# Patient Record
Sex: Female | Born: 1990 | Race: Black or African American | Hispanic: No | Marital: Single | Smoking: Never smoker
Health system: Southern US, Community
[De-identification: ages and names within clinical notes are randomized; demographics above are authoritative.]

## PROBLEM LIST (undated history)

## (undated) ENCOUNTER — Inpatient Hospital Stay (HOSPITAL_COMMUNITY): Payer: Self-pay

## (undated) DIAGNOSIS — F419 Anxiety disorder, unspecified: Secondary | ICD-10-CM

## (undated) DIAGNOSIS — A64 Unspecified sexually transmitted disease: Secondary | ICD-10-CM

## (undated) DIAGNOSIS — E669 Obesity, unspecified: Secondary | ICD-10-CM

## (undated) DIAGNOSIS — F32A Depression, unspecified: Secondary | ICD-10-CM

## (undated) DIAGNOSIS — F329 Major depressive disorder, single episode, unspecified: Secondary | ICD-10-CM

## (undated) DIAGNOSIS — R51 Headache: Secondary | ICD-10-CM

## (undated) DIAGNOSIS — N8003 Adenomyosis of the uterus: Secondary | ICD-10-CM

## (undated) HISTORY — DX: Unspecified sexually transmitted disease: A64

## (undated) HISTORY — DX: Adenomyosis of the uterus: N80.03

## (undated) HISTORY — PX: SLEEVE GASTROPLASTY: SHX1101

## (undated) HISTORY — PX: WISDOM TOOTH EXTRACTION: SHX21

---

## 2009-05-03 ENCOUNTER — Inpatient Hospital Stay (HOSPITAL_COMMUNITY): Admission: AD | Admit: 2009-05-03 | Discharge: 2009-05-03 | Payer: Self-pay | Admitting: Obstetrics and Gynecology

## 2009-08-02 ENCOUNTER — Inpatient Hospital Stay (HOSPITAL_COMMUNITY): Admission: AD | Admit: 2009-08-02 | Discharge: 2009-08-06 | Payer: Self-pay | Admitting: Obstetrics and Gynecology

## 2009-08-03 ENCOUNTER — Encounter (INDEPENDENT_AMBULATORY_CARE_PROVIDER_SITE_OTHER): Payer: Self-pay | Admitting: Obstetrics and Gynecology

## 2010-01-10 ENCOUNTER — Emergency Department (HOSPITAL_COMMUNITY): Admission: EM | Admit: 2010-01-10 | Discharge: 2010-01-10 | Payer: Self-pay | Admitting: Emergency Medicine

## 2010-02-14 ENCOUNTER — Emergency Department (HOSPITAL_COMMUNITY)
Admission: EM | Admit: 2010-02-14 | Discharge: 2010-02-14 | Payer: Self-pay | Source: Home / Self Care | Admitting: Emergency Medicine

## 2010-02-15 ENCOUNTER — Emergency Department (HOSPITAL_COMMUNITY)
Admission: EM | Admit: 2010-02-15 | Discharge: 2010-02-15 | Disposition: A | Discharge status: Other Acute Inpatient Hospital | Payer: Self-pay | Source: Home / Self Care | Admitting: Family Medicine

## 2010-02-15 ENCOUNTER — Emergency Department (HOSPITAL_COMMUNITY)
Admission: EM | Admit: 2010-02-15 | Discharge: 2010-02-15 | Payer: Self-pay | Source: Home / Self Care | Admitting: Emergency Medicine

## 2010-05-09 LAB — BASIC METABOLIC PANEL
BUN: 5 mg/dL — ABNORMAL LOW (ref 6–23)
Calcium: 9.4 mg/dL (ref 8.4–10.5)
Creatinine, Ser: 0.61 mg/dL (ref 0.4–1.2)
GFR calc non Af Amer: >60 mL/min (ref >60)
Glucose, Bld: 110 mg/dL — ABNORMAL HIGH (ref 70–99)
Potassium: 3.5 mEq/L (ref 3.5–5.1)
Sodium: 138 mEq/L (ref 135–145)

## 2010-05-09 LAB — DIFFERENTIAL
Eosinophils Absolute: 0.4 10*3/uL (ref 0.0–0.7)
Neutro Abs: 7.5 10*3/uL (ref 1.7–7.7)
Neutrophils Relative %: 65 % (ref 43–77)

## 2010-05-09 LAB — CBC
HCT: 37.6 % (ref 36.0–46.0)
MCH: 28.5 pg (ref 26.0–34.0)
MCV: 83 fL (ref 78.0–100.0)
Platelets: 258 10*3/uL (ref 150–400)
RDW: 13.3 % (ref 11.5–15.5)

## 2010-05-16 LAB — CBC
HCT: 25.3 % — ABNORMAL LOW (ref 36.0–46.0)
Hemoglobin: 12.2 g/dL (ref 12.0–15.0)
Hemoglobin: 8.9 g/dL — ABNORMAL LOW (ref 12.0–15.0)
MCV: 90 fL (ref 78.0–100.0)
MCV: 91.3 fL (ref 78.0–100.0)
Platelets: 226 10*3/uL (ref 150–400)
RBC: 3.98 MIL/uL (ref 3.87–5.11)
RDW: 14.8 % (ref 11.5–15.5)
WBC: 17.4 10*3/uL — ABNORMAL HIGH (ref 4.0–10.5)

## 2010-05-16 LAB — RPR: RPR Ser Ql: NONREACTIVE

## 2010-05-23 LAB — URINALYSIS, ROUTINE W REFLEX MICROSCOPIC
Bilirubin Urine: NEGATIVE
Glucose, UA: NEGATIVE mg/dL
Hgb urine dipstick: NEGATIVE
Protein, ur: NEGATIVE mg/dL
Specific Gravity, Urine: 1.02 (ref 1.005–1.030)

## 2010-05-23 LAB — WET PREP, GENITAL
Clue Cells Wet Prep HPF POC: NONE SEEN
Trich, Wet Prep: NONE SEEN
Yeast Wet Prep HPF POC: NONE SEEN

## 2010-05-23 LAB — URINE CULTURE: Colony Count: NO GROWTH

## 2010-05-23 LAB — URINE MICROSCOPIC-ADD ON

## 2011-08-08 ENCOUNTER — Other Ambulatory Visit: Payer: Self-pay | Admitting: Obstetrics & Gynecology

## 2011-08-08 ENCOUNTER — Emergency Department (HOSPITAL_COMMUNITY): Payer: Medicaid Other

## 2011-08-08 ENCOUNTER — Encounter (HOSPITAL_COMMUNITY): Payer: Self-pay | Admitting: *Deleted

## 2011-08-08 ENCOUNTER — Emergency Department (HOSPITAL_COMMUNITY)
Admission: EM | Admit: 2011-08-08 | Discharge: 2011-08-08 | Disposition: A | Discharge status: Home / Self Care | Payer: Medicaid Other | Attending: Emergency Medicine | Admitting: Emergency Medicine

## 2011-08-08 DIAGNOSIS — O200 Threatened abortion: Secondary | ICD-10-CM

## 2011-08-08 DIAGNOSIS — O039 Complete or unspecified spontaneous abortion without complication: Secondary | ICD-10-CM | POA: Insufficient documentation

## 2011-08-08 LAB — POCT I-STAT, CHEM 8
BUN: 10 mg/dL (ref 6–23)
Chloride: 108 mEq/L (ref 96–112)
Creatinine, Ser: 0.6 mg/dL (ref 0.50–1.10)
Glucose, Bld: 142 mg/dL — ABNORMAL HIGH (ref 70–99)
Hemoglobin: 9.9 g/dL — ABNORMAL LOW (ref 12.0–15.0)
Potassium: 4.3 mEq/L (ref 3.5–5.1)
Sodium: 140 mEq/L (ref 135–145)
TCO2: 19 mmol/L (ref 0–100)

## 2011-08-08 LAB — TYPE AND SCREEN
ABO/RH(D): O POS
Antibody Screen: NEGATIVE

## 2011-08-08 LAB — CBC
MCH: 27.8 pg (ref 26.0–34.0)
RBC: 3.45 MIL/uL — ABNORMAL LOW (ref 3.87–5.11)
WBC: 13.2 10*3/uL — ABNORMAL HIGH (ref 4.0–10.5)

## 2011-08-08 LAB — GC/CHLAMYDIA PROBE AMP, GENITAL: GC Probe Amp, Genital: NEGATIVE

## 2011-08-08 MED ORDER — MORPHINE SULFATE 4 MG/ML IJ SOLN
4.0000 mg | Freq: Once | INTRAMUSCULAR | Status: AC
  Administered 2011-08-08: 4 mg via INTRAVENOUS
  Filled 2011-08-08: qty 1

## 2011-08-08 MED ORDER — IBUPROFEN 600 MG PO TABS: 600.0000 mg | ORAL_TABLET | Freq: Four times a day (QID) | ORAL | Status: AC | PRN

## 2011-08-08 MED ORDER — SODIUM CHLORIDE 0.9 % IV BOLUS (SEPSIS)
1000.0000 mL | Freq: Once | INTRAVENOUS | Status: AC
  Administered 2011-08-08: 1000 mL via INTRAVENOUS

## 2011-08-08 NOTE — ED Provider Notes (Signed)
History     CSN: [id number]  Arrival date & time 08/08/11  7829   First MD Initiated Contact with Patient 08/08/11 [phone]      Chief Complaint  Patient presents with  . Vaginal Bleeding    started 2200     HPI Pt thinks she could be having a miscarriage.  Her last normal menstrual period was in April.  Pt had been on depo and her last shot was in January.  She had a positive pregnancy test in April but then another one following that was negative.  She started having heavy vaginal bleeding last night.  She has had  cramping abdominal pain as well.  She has passed clots.  The symptoms are not any better.  They are severe. The pain feels like labor contractions. g1p1  History reviewed. No pertinent past medical history.  Past Surgical History  Procedure Date  . Cesarean section     No family history on file.  History  Substance Use Topics  . Smoking status: Never Smoker   . Smokeless tobacco: Not on file  . Alcohol Use: Yes     occ    OB History    Grav Para Term Preterm Abortions TAB SAB Ect Mult Living                  Review of Systems  Genitourinary: Positive for pelvic pain.  Neurological: Positive for light-headedness. Negative for syncope.  All other systems reviewed and are negative.    Allergies  Review of patient's allergies indicates no known allergies.  Home Medications  No current outpatient prescriptions on file.  BP 96/54  Pulse 76  Temp(Src) 98.6 F (37 C) (Oral)  Resp 16  SpO2 100% Repeat bp 111/62 at bedside Physical Exam  Nursing note and vitals reviewed. Constitutional: She appears well-developed and well-nourished. No distress.  HENT:  Head: Normocephalic and atraumatic.  Right Ear: External ear normal.  Left Ear: External ear normal.  Eyes: Conjunctivae are normal. Right eye exhibits no discharge. Left eye exhibits no discharge. No scleral icterus.  Neck: Neck supple. No tracheal deviation present.  Cardiovascular: Normal rate,  regular rhythm and intact distal pulses.   Pulmonary/Chest: Effort normal and breath sounds normal. No stridor. No respiratory distress. She has no wheezes. She has no rales.  Abdominal: Soft. Bowel sounds are normal. She exhibits no distension. There is tenderness in the suprapubic area. There is guarding. There is no rebound.  Musculoskeletal: She exhibits no edema and no tenderness.  Neurological: She is alert. She has normal strength. No sensory deficit. Cranial nerve deficit:  no gross defecits noted. She exhibits normal muscle tone. She displays no seizure activity. Coordination normal.  Skin: Skin is warm and dry. No rash noted.  Psychiatric: She has a normal mood and affect.    ED Course  Procedures (including critical care time)  Labs Reviewed  CBC - Abnormal; Notable for the following:    WBC 13.2 (*)    RBC 3.45 (*)    Hemoglobin 9.6 (*)    HCT 28.2 (*)    All other components within normal limits  PREGNANCY, URINE - Abnormal; Notable for the following:    Preg Test, Ur POSITIVE (*)    All other components within normal limits  HCG, SERUM, QUALITATIVE - Abnormal; Notable for the following:    Preg, Serum POSITIVE (*)    All other components within normal limits  POCT I-STAT, CHEM 8 - Abnormal; Notable for the  following:    Glucose, Bld 142 (*)    Hemoglobin 9.9 (*)    HCT 29.0 (*)    All other components within normal limits  HCG, QUANTITATIVE, PREGNANCY - Abnormal; Notable for the following:    hCG, Beta Chain, Quant, S 4789 (*)    All other components within normal limits  TYPE AND SCREEN  ABO/RH  GC/CHLAMYDIA PROBE AMP, GENITAL   US Ob Comp Less 14 Wks  08/08/2011  *RADIOLOGY REPORT*  Clinical Data: Vaginal bleeding.  Pelvic pain.  7-week-6-day gestational age by LMP.  OBSTETRIC <14 WK [place] AND TRANSVAGINAL OB US  Technique:  Both transabdominal and transvaginal ultrasound examinations were performed for complete evaluation of the gestation as well as the maternal  uterus, adnexal regions, and pelvic cul-de-sac.  Transvaginal technique was performed to assess early pregnancy.  Comparison:  None.  Findings:  There is no evidence of an intrauterine gestational sac. The endometrium appears thickened and there is a focal hypoechoic area seen in the posterior portion of the endometrial cavity suspicious for blood clot.  No definite fibroids identified.  Both ovaries are normal in appearance.  No adnexal mass or free fluid identified.  IMPRESSION: No IUP or adnexal mass identified.  Probable blood clot seen in endometrial cavity.  Differential diagnosis includes recent spontaneous abortion, IUP too early to visualize, and occult ectopic pregnancy.  Recommend close follow-up of quantitative B-HCG levels, and followup US as clinically warranted.  Original Report Authenticated By: Danae Orleans, M.D.   US Ob Transvaginal  08/08/2011  *RADIOLOGY REPORT*  Clinical Data: Vaginal bleeding.  Pelvic pain.  7-week-6-day gestational age by LMP.  OBSTETRIC <14 WK [place] AND TRANSVAGINAL OB US  Technique:  Both transabdominal and transvaginal ultrasound examinations were performed for complete evaluation of the gestation as well as the maternal uterus, adnexal regions, and pelvic cul-de-sac.  Transvaginal technique was performed to assess early pregnancy.  Comparison:  None.  Findings:  There is no evidence of an intrauterine gestational sac. The endometrium appears thickened and there is a focal hypoechoic area seen in the posterior portion of the endometrial cavity suspicious for blood clot.  No definite fibroids identified.  Both ovaries are normal in appearance.  No adnexal mass or free fluid identified.  IMPRESSION: No IUP or adnexal mass identified.  Probable blood clot seen in endometrial cavity.  Differential diagnosis includes recent spontaneous abortion, IUP too early to visualize, and occult ectopic pregnancy.  Recommend close follow-up of quantitative B-HCG levels, and followup US as  clinically warranted.  Original Report Authenticated By: Danae Orleans, M.D.     No diagnosis found.    MDM  10:43 AM Reviewed findings with patient.  She is feeling better.  Bleeding has decreased.  DDX still includes occult ectopic and spontaneous AB, less likely early IUP  With the amount of bleeding and pain.  Discussed with Dr Hyacinth Meeker, on call for GYN.  Will review the [place].  Most likely consistent miscarriage.     D/w Dr Hyacinth Meeker.  Will follow up on Thursday for repeat quant.  Close precautions discussed with patient.         Celene Kras, MD 08/08/11 [phone]

## 2011-08-08 NOTE — ED Notes (Signed)
Pt is on depo irreg periods.  Started having vaginal bleeding that is bright red with chucks,  Pt states soaking sanitary napkins.  Pt states that it feels like she is in labor, pt had a positive pregnancy test in April and took another and it was negative.  Pt hypotensive in triage.  bp 82/60.  Laid down and bp 98/70.  Lower abdominal pain

## 2011-08-08 NOTE — Discharge Instructions (Signed)
Miscarriage (Spontaneous Miscarriage)  A miscarriage is when you lose your baby before the twentieth week of pregnancy. Miscarriages happen in 15-20% of pregnancies. Most miscarriages happen in the first 13 weeks of the pregnancy. In medical terms, this is called a spontaneous miscarriage or early pregnancy loss. No further treatment is needed when the miscarriage is complete and all products of conception have been passed out of the body. You can begin trying for another pregnancy as soon as your caregiver says it is okay.  CAUSES    Most causes are not known.   Genetic problems like abnormal, not enough or too many chromosomes.   Infection of the cervix or uterus.   An abnormal shaped uterus, fibroid tumors or congenital abnormalities.   Hormone problems.   Medical problems.   Incompetent cervix, the tissue in the cervix is not strong enough to hold the pregnancy.   Smoking, too much alcohol use and illegal drugs.   Trauma.  SYMPTOMS    Bleeding or spotting from the vagina.   Cramping of the lower abdomen.   Passing of fluid from the vagina with or without cramps or pain.   Passing fetal tissue.  TREATMENT    Sometimes no further treatment is necessary if you pass all the tissue in the uterus.   If partial parts of the fetus or placenta remain in the body (incomplete miscarriage), tissue left behind may become infected. Usually a D and C (Dilatation and Curettage) suction or scrapping of the uterus is necessary to remove the remaining tissue in uterus. The procedure is only done when your caregiver knows that there is no chance for the pregnancy to continue. This is determined by a physical exam, a negative pregnancy test, blood tests and perhaps an ultrasound revealing a dead fetus or no fetus developing because a problem occurred at conception (when the sperm and egg unite).   Medications may be necessary, antibiotics if there is an infection or medications to contract the uterus if there is a  lot of bleeding.   If you have Rh negative blood and your partner is Rh positive, you will need a Rho-gam shot (an immune globulin vaccine). This will protect your baby from having Rh blood problems in future pregnancies.  HOME CARE INSTRUCTIONS    Your caregiver may order bed rest (up to the bathroom only). He or she may allow you to continue light activity. You may need to make arrangements for the care of children and for any other responsibilities.   Keep track of the number of pads you use each day and how soaked (saturated) they are. Record this information.   Do not use tampons. Do not douche or have sexual intercourse until approved by your caregiver.   Only take over-the-counter or prescription medicines for pain, discomfort or fever as directed by your caregiver.   Do not take aspirin because it can cause bleeding.   It is very important to keep all follow-up appointments for re-evaluations and continuing management.   Tell your caregiver if you are experiencing domestic violence.   Women who have an Rh negative blood type (i.e., A, B, AB, or O negative) need to receive a drug called Rh(D) immune globulin (RhoGam). This medicine helps protect future fetuses against problems that can occur if an Rh negative mother is carrying a baby who is Rh positive.   If you and/or your partner are having problems with guilt or grieving, talk to your caregiver or seek counseling to help   you cope with the pregnancy loss. Allow enough time to grieve before trying to get pregnant again.  SEEK IMMEDIATE MEDICAL CARE IF:    You have severe cramps or pain in your stomach, back, or belly (abdomen).   You have a fever.   You pass large clots or tissue. Save any tissue for your caregiver to inspect.   Your bleeding increases.   You become light-headed, weak or have fainting episodes.   You develop chills.  Document Released: 08/09/2000 Document Revised: 02/02/2011 Document Reviewed: 09/16/2007  [employer] Patient  Information 2012 [employer]

## 2011-08-08 NOTE — ED Notes (Signed)
Ambulated patient without any problems. Patient stated she felt ok walking.

## 2011-08-16 ENCOUNTER — Encounter: Payer: Self-pay | Admitting: Obstetrics & Gynecology

## 2011-08-16 ENCOUNTER — Telehealth: Payer: Self-pay | Admitting: Obstetrics & Gynecology

## 2011-08-16 NOTE — Telephone Encounter (Signed)
Patient did not follow up as directed after ER visit.  She was to be seen for follow-up HCG at [facility].  Have attempted phone calls to all numbers provided.  No answers and no voice mails are set up.  A letter will be sent from my office.

## 2011-10-29 ENCOUNTER — Encounter (HOSPITAL_COMMUNITY): Payer: Self-pay

## 2011-10-29 ENCOUNTER — Emergency Department (HOSPITAL_COMMUNITY)
Admission: EM | Admit: 2011-10-29 | Discharge: 2011-10-29 | Disposition: A | Discharge status: Home / Self Care | Payer: Medicaid Other | Source: Home / Self Care | Attending: Family Medicine | Admitting: Family Medicine

## 2011-10-29 DIAGNOSIS — H00019 Hordeolum externum unspecified eye, unspecified eyelid: Secondary | ICD-10-CM

## 2011-10-29 DIAGNOSIS — H00011 Hordeolum externum right upper eyelid: Secondary | ICD-10-CM

## 2011-10-29 MED ORDER — CEPHALEXIN 500 MG PO CAPS: 500.0000 mg | ORAL_CAPSULE | Freq: Three times a day (TID) | ORAL | Status: AC

## 2011-10-29 MED ORDER — ERYTHROMYCIN 5 MG/GM OP OINT: TOPICAL_OINTMENT | OPHTHALMIC | Status: AC

## 2011-10-29 NOTE — ED Provider Notes (Signed)
History     CSN: [id number]  Arrival date & time 10/29/11  1121   First MD Initiated Contact with Patient 10/29/11 1124      No chief complaint on file.   (Consider location/radiation/quality/duration/timing/severity/associated sxs/prior treatment) Patient is a 21 y.o. female presenting with eye problem. The history is provided by the patient.  Eye Problem  This is a new problem. The current episode started more than 2 days ago. The problem has been gradually worsening (h/o twice a yr conjuctivitis. sx similar.). There is pain in the right eye. The patient is experiencing no pain. There is no history of trauma to the eye. There is no known exposure to pink eye. She does not wear contacts. Pertinent negatives include no discharge, no photophobia and no eye redness.    No past medical history on file.  Past Surgical History  Procedure Date  . Cesarean section     No family history on file.  History  Substance Use Topics  . Smoking status: Never Smoker   . Smokeless tobacco: Not on file  . Alcohol Use: Yes     occ    OB History    Grav Para Term Preterm Abortions TAB SAB Ect Mult Living                  Review of Systems  Constitutional: Negative.   HENT: Negative.   Eyes: Positive for pain. Negative for photophobia, discharge, redness and visual disturbance.    Allergies  Review of patient's allergies indicates no known allergies.  Home Medications   Current Outpatient Rx  Name Route Sig Dispense Refill  . CEPHALEXIN 500 MG PO CAPS Oral Take 1 capsule (500 mg total) by mouth 3 (three) times daily. Take all of medicine and drink lots of fluids 21 capsule 0  . ERYTHROMYCIN 5 MG/GM OP OINT  Apply qid to eye after warm cloth soak 3.5 g 0    BP 115/72  Pulse 73  Temp 98.8 F (37.1 C) (Oral)  Resp 18  SpO2 99%  Physical Exam  Nursing note and vitals reviewed. Constitutional: She appears well-developed and well-nourished.  HENT:  Right Ear: External ear  normal.  Left Ear: External ear normal.  Mouth/Throat: Oropharynx is clear and moist.  Eyes: Conjunctivae and EOM are normal. Pupils are equal, round, and reactive to light. No foreign bodies found. Right eye exhibits hordeolum. Right eye exhibits no chemosis, no discharge and no exudate. Left eye exhibits no hordeolum.    ED Course  Procedures (including critical care time)  Labs Reviewed - No data to display No results found.   1. Hordeolum externum of right upper eyelid       MDM          Linna Hoff, MD 10/29/11 1253

## 2011-10-29 NOTE — ED Notes (Signed)
Pt has swollen rt eye since Friday.

## 2011-10-29 NOTE — ED Notes (Signed)
Pt has swelling and pain of rt upper lid since Friday.

## 2012-06-25 ENCOUNTER — Emergency Department (HOSPITAL_COMMUNITY)
Admission: EM | Admit: 2012-06-25 | Discharge: 2012-06-25 | Disposition: A | Discharge status: Home / Self Care | Payer: Medicaid Other | Source: Home / Self Care | Attending: Family Medicine | Admitting: Family Medicine

## 2012-06-25 ENCOUNTER — Encounter (HOSPITAL_COMMUNITY): Payer: Self-pay | Admitting: Emergency Medicine

## 2012-06-25 DIAGNOSIS — R1110 Vomiting, unspecified: Secondary | ICD-10-CM

## 2012-06-25 DIAGNOSIS — R51 Headache: Secondary | ICD-10-CM

## 2012-06-25 MED ORDER — METOCLOPRAMIDE HCL 5 MG/ML IJ SOLN
10.0000 mg | Freq: Once | INTRAMUSCULAR | Status: AC
  Administered 2012-06-25: 10 mg via INTRAMUSCULAR

## 2012-06-25 MED ORDER — KETOROLAC TROMETHAMINE 60 MG/2ML IM SOLN
INTRAMUSCULAR | Status: AC
  Filled 2012-06-25: qty 2

## 2012-06-25 MED ORDER — DIPHENHYDRAMINE HCL 50 MG/ML IJ SOLN
25.0000 mg | Freq: Once | INTRAMUSCULAR | Status: AC
  Administered 2012-06-25: 25 mg via INTRAMUSCULAR

## 2012-06-25 MED ORDER — METOCLOPRAMIDE HCL 5 MG/ML IJ SOLN
INTRAMUSCULAR | Status: AC
  Filled 2012-06-25: qty 2

## 2012-06-25 MED ORDER — KETOROLAC TROMETHAMINE 60 MG/2ML IM SOLN
60.0000 mg | Freq: Once | INTRAMUSCULAR | Status: AC
  Administered 2012-06-25: 60 mg via INTRAMUSCULAR

## 2012-06-25 MED ORDER — ONDANSETRON 4 MG PO TBDP
ORAL_TABLET | ORAL | Status: AC
  Filled 2012-06-25: qty 1

## 2012-06-25 MED ORDER — ONDANSETRON 4 MG PO TBDP: 4.0000 mg | ORAL_TABLET | Freq: Three times a day (TID) | ORAL | Status: DC | PRN

## 2012-06-25 MED ORDER — DIPHENHYDRAMINE HCL 50 MG/ML IJ SOLN
INTRAMUSCULAR | Status: AC
  Filled 2012-06-25: qty 1

## 2012-06-25 MED ORDER — ONDANSETRON 4 MG PO TBDP
4.0000 mg | ORAL_TABLET | Freq: Once | ORAL | Status: AC
  Administered 2012-06-25: 4 mg via ORAL

## 2012-06-25 NOTE — ED Notes (Signed)
Pt given injections will discharge at 8:30

## 2012-06-25 NOTE — ED Notes (Signed)
Pt c/o vomiting and diarrhea that started at 7 a.m this morning. Unable to keep anything down. Hot and cold chills.  Pt is unsure if she had a fever.  Pt states that she didn't get a migraine until around 3 this evening.

## 2012-06-25 NOTE — ED Provider Notes (Signed)
History     CSN: [id number]  Arrival date & time 06/25/12  1818   First MD Initiated Contact with Patient 06/25/12 1924      Chief Complaint  Patient presents with  . Emesis    vomiting and diarrhea this a.m started at 7  . Migraine    (Consider location/radiation/quality/duration/timing/severity/associated sxs/prior treatment) Patient is a 22 y.o. female presenting with vomiting and migraines. The history is provided by the patient. No language interpreter was used.  Emesis Severity:  Moderate Timing:  Constant Number of daily episodes:  Multiple Progression:  Worsening Chronicity:  New Recent urination:  Normal Relieved by:  Nothing Worsened by:  Nothing tried Associated symptoms: headaches   Migraine Associated symptoms include headaches.   Pt complains of a migrane headache, vomitting and diarrhea.  Pt unable to keep fluids down History reviewed. No pertinent past medical history.  Past Surgical History  Procedure Laterality Date  . Cesarean section      History reviewed. No pertinent family history.  History  Substance Use Topics  . Smoking status: Never Smoker   . Smokeless tobacco: Not on file  . Alcohol Use: Yes     Comment: occ    OB History   Grav Para Term Preterm Abortions TAB SAB Ect Mult Living                  Review of Systems  Gastrointestinal: Positive for vomiting.  Neurological: Positive for headaches.  All other systems reviewed and are negative.    Allergies  Review of patient's allergies indicates no known allergies.  Home Medications  No current outpatient prescriptions on file.  BP 105/55  Pulse 111  Temp(Src) 100.1 F (37.8 C) (Oral)  Resp 16  SpO2 100%  LMP 06/15/2012  Physical Exam  Nursing note and vitals reviewed. Constitutional: She is oriented to person, place, and time. She appears well-developed and well-nourished.  HENT:  Head: Normocephalic and atraumatic.  Right Ear: External ear normal.  Left Ear:  External ear normal.  Nose: Nose normal.  Mouth/Throat: Oropharynx is clear and moist.  Eyes: Conjunctivae and EOM are normal. Pupils are equal, round, and reactive to light.  Neck: Normal range of motion.  Cardiovascular: Normal rate and normal heart sounds.   Pulmonary/Chest: Effort normal.  Abdominal: She exhibits no distension. There is no tenderness.  Musculoskeletal: Normal range of motion.  Neurological: She is alert and oriented to person, place, and time.  Skin: Skin is warm and dry.  Psychiatric: She has a normal mood and affect.    ED Course  Procedures (including critical care time)  Labs Reviewed - No data to display No results found.   1. Vomiting   2. Headache       MDM  Pt given [place] cocktail and po zofran,   Pt given po fluid challenge.   Pt advised to go to Ed if symtptoms worsen or change        Elson Areas, PA-C 06/25/12 2005  Lonia Skinner [place], [place] 06/25/12 2006

## 2012-07-05 NOTE — ED Provider Notes (Signed)
Medical screening examination/treatment/procedure(s) were performed by resident physician or non-physician practitioner and as supervising physician I was immediately available for consultation/collaboration.   Blue Winther DOUGLAS MD.   Lainee Lehrman D Matt Delpizzo, MD 07/05/12 1015 

## 2012-08-02 ENCOUNTER — Ambulatory Visit (HOSPITAL_COMMUNITY)
Admission: RE | Admit: 2012-08-02 | Discharge: 2012-08-02 | Disposition: A | Discharge status: Home / Self Care | Payer: Medicaid Other | Attending: Psychiatry | Admitting: Psychiatry

## 2012-08-02 ENCOUNTER — Encounter (HOSPITAL_COMMUNITY): Payer: Self-pay | Admitting: *Deleted

## 2012-08-02 DIAGNOSIS — F411 Generalized anxiety disorder: Secondary | ICD-10-CM | POA: Insufficient documentation

## 2012-08-02 DIAGNOSIS — R51 Headache: Secondary | ICD-10-CM

## 2012-08-02 DIAGNOSIS — F331 Major depressive disorder, recurrent, moderate: Secondary | ICD-10-CM | POA: Insufficient documentation

## 2012-08-02 HISTORY — DX: Headache: R51

## 2012-08-02 NOTE — BH Assessment (Addendum)
Assessment Note   Mary Whitehead is a 22 y.o. single black female.  She presents unaccompanied seeking help with depression and anxiety after finding information about [facility] on-line.  She presents today because "I feel I can't go to work."  Regarding her depression, pt states, "It's something that I've always had."  However, it has worsened over the past couple weeks.  Pt reports that she is overly stressed due to work, finances, and her father's health.  When asked what would happen if she went to work, she reports that she would not feel as productive as she needs to be.  She adds that her job is currently in jeopardy.  She reports that she lives in a shared household with her mother, her boyfriend, and two children, one of whom is hers, and the other of whom, "I take care of."  However, only she and her mother are currently employed, creating financial difficulties for them.  Also, pt reports that her father is currently being treated for cancer, with two scheduled surgeries in the near future.  Pt denies SI.  She reports a history of a single attempt by overdose at 22 y/o triggered by stressors she cannot recall.  She slept the medications off and did not receive any kind of treatment.  She denies any history of self mutilation.  Pt endorses depressed mood with symptoms noted in the "risk to self" assessment below.  She also reports problems with anxiety, and has recently presented at the ED for tachycardia that was found to be secondary to anxiety.  She denies HI or recent physical aggression, and is calm and cooperative at this time.  She denies hallucinations.  She exhibits no delusional thought or evidence of internal stimuli, and her reality testing appears to be intact.  She denies any substance abuse problems.  Pt identifies both her mother and her boyfriend as social supports.  She denies any history of psychiatric hospitalization.  She reports that she saw an outpatient therapist whose name she  cannot recall in 2011 or 2012, but has had no other treatment history.  She is seeking outpatient treatment today.    Axis I: Major Depressive Disorder, recurrent, moderate 296.32; Anxiety Disorder NOS 300.00 Axis II: Deferred Axis III:  Past Medical History  Diagnosis Date  . Headache(784.0) 08/02/2012    Migraines   Axis IV: economic problems, occupational problems and problems with primary support group Axis V: GAF = 45  Past Medical History:  Past Medical History  Diagnosis Date  . Headache(784.0) 08/02/2012    Migraines    Past Surgical History  Procedure Laterality Date  . Cesarean section      Family History: No family history on file.  Social History:  reports that she has never smoked. She does not have any smokeless tobacco history on file. She reports that  drinks alcohol. She reports that she does not use illicit drugs.  Additional Social History:  Alcohol / Drug Use History of alcohol / drug use?: No history of alcohol / drug abuse  CIWA:   COWS:    Allergies: No Known Allergies  Home Medications:  (Not in a hospital admission)  OB/GYN Status:  No LMP recorded.  General Assessment Data Location of Assessment: [facility] Services Living Arrangements: Parent;Children;Other relatives (Mother, boyfriend, children ages 2 & 67 y/o) Can pt return to current living arrangement?: Yes Admission Status: Voluntary Is patient capable of signing voluntary admission?: Yes Transfer from: Home Referral Source: Self/Family/Friend (Found referral  on-line)  Education Status Is patient currently in school?: No Highest grade of school patient has completed: Some college  Risk to self Suicidal Ideation: No Suicidal Intent: No Is patient at risk for suicide?: No Suicidal Plan?: No Access to Means: No What has been your use of drugs/alcohol within the last 12 months?: Denies Previous Attempts/Gestures: Yes How many times?: 1 (Overdose at 22 y/o) Other Self Harm  Risks: None reported Triggers for Past Attempts: Unknown (Does not recall.) Intentional Self Injurious Behavior: None Family Suicide History: Yes (Mother: failed attempt; Mother, Grandmother: depression) Recent stressful life event(s): Financial Problems;Other (Comment) (Problems at work; father's health problem) Persecutory voices/beliefs?: No Depression: Yes Depression Symptoms: Insomnia;Tearfulness;Isolating;Fatigue;Guilt;Feeling worthless/self pity;Feeling angry/irritable Substance abuse history and/or treatment for substance abuse?: No Suicide prevention information given to non-admitted patients: Yes  Risk to Others Homicidal Ideation: No Thoughts of Harm to Others: No Current Homicidal Intent: No Current Homicidal Plan: No Access to Homicidal Means: No Identified Victim: None History of harm to others?: No Assessment of Violence: None Noted Violent Behavior Description: Calm/cooperative Does patient have access to weapons?: Yes (Comment) (Denies access to firearms) Criminal Charges Pending?: No Does patient have a court date: No  Psychosis Hallucinations: None noted Delusions: None noted  Mental Status Report Appear/Hygiene: Other (Comment) (Neat, well groomed) Eye Contact: Good Motor Activity: Unremarkable Speech: Other (Comment) (Unremarkable) Level of Consciousness: Alert Mood: Depressed;Other (Comment) (Tearful) Affect: Other (Comment) (Constricted) Anxiety Level: Minimal Thought Processes: Coherent;Relevant Judgement: Unimpaired Orientation: Person;Place;Time;Situation Obsessive Compulsive Thoughts/Behaviors: None  Cognitive Functioning Concentration: Normal Memory: Recent Intact;Remote Intact IQ: Average Insight: Good Impulse Control: Good Appetite: Fair Weight Loss: 0 Weight Gain: 0 Sleep: Increased Total Hours of Sleep: 8 (7 - 8 hrs/night, but disrupted & not restful) Vegetative Symptoms: None  ADLScreening [facility]) Patient's  cognitive ability adequate to safely complete daily activities?: Yes Patient able to express need for assistance with ADLs?: Yes Independently performs ADLs?: Yes (appropriate for developmental age)  Abuse/Neglect [facility]) Physical Abuse: Denies Verbal Abuse: Denies Sexual Abuse: Denies  Prior Inpatient Therapy Prior Inpatient Therapy: No Prior Therapy Dates: None Prior Therapy Facilty/Provider(s): None Reason for Treatment: None  Prior Outpatient Therapy Prior Outpatient Therapy: Yes Prior Therapy Dates: 2011 or 2012: Therapist NOS for depression  ADL Screening (condition at time of admission) Patient's cognitive ability adequate to safely complete daily activities?: Yes Patient able to express need for assistance with ADLs?: Yes Independently performs ADLs?: Yes (appropriate for developmental age) Weakness of Legs: None Weakness of Arms/Hands: None  Home Assistive Devices/Equipment Home Assistive Devices/Equipment: None    Abuse/Neglect Assessment (Assessment to be complete while patient is alone) Physical Abuse: Denies Verbal Abuse: Denies Sexual Abuse: Denies Exploitation of patient/patient's resources: Denies Self-Neglect: Denies     Merchant navy officer (For Healthcare) Advance Directive: Patient does not have advance directive;Patient would not like information Pre-existing out of facility DNR order (yellow form or pink MOST form): No Nutrition Screen- [facility] Adult/WL/AP Patient's home diet: Regular Have you recently lost weight without trying?: No Have you been eating poorly because of a decreased appetite?: No Malnutrition Screening Tool Score: 0  Additional Information 1:1 In Past 12 Months?: No CIRT Risk: No Elopement Risk: No Does patient have medical clearance?: No     Disposition:  Disposition Initial Assessment Completed for this Encounter: Yes Disposition of Patient: Referred to Patient referred to: Other (Comment) ([facility]) After reviewing pt with Serena Colonel, NP it has been determined that pt is not  a danger to herself or others at this time and does not require psychiatric hospitalization.  Pt accepted written referrals to [facility], [facility], and [facility].  She was advised to call today to establish an appointment.  Pt was offered [facility], but declined it.  She departed from [facility] at 09:25.  On Site Evaluation by:   Reviewed with Physician:  Serena Colonel, NP @ 09:25  Doylene Canning, MA Assessment Counselor Raphael Gibney 08/02/2012 10:30 AM

## 2012-10-09 ENCOUNTER — Encounter: Payer: Self-pay | Admitting: Obstetrics and Gynecology

## 2012-10-11 ENCOUNTER — Encounter (HOSPITAL_COMMUNITY): Payer: Self-pay | Admitting: Emergency Medicine

## 2012-10-11 ENCOUNTER — Emergency Department (HOSPITAL_COMMUNITY)
Admission: EM | Admit: 2012-10-11 | Discharge: 2012-10-11 | Disposition: A | Discharge status: Home / Self Care | Payer: BC Managed Care – PPO | Source: Home / Self Care | Attending: Family Medicine | Admitting: Family Medicine

## 2012-10-11 DIAGNOSIS — A0811 Acute gastroenteropathy due to Norwalk agent: Secondary | ICD-10-CM

## 2012-10-11 MED ORDER — ONDANSETRON 4 MG PO TBDP
8.0000 mg | ORAL_TABLET | Freq: Once | ORAL | Status: AC
  Administered 2012-10-11: 8 mg via ORAL

## 2012-10-11 MED ORDER — ONDANSETRON 4 MG PO TBDP
ORAL_TABLET | ORAL | Status: AC
  Filled 2012-10-11: qty 2

## 2012-10-11 MED ORDER — ONDANSETRON HCL 4 MG PO TABS: 4.0000 mg | ORAL_TABLET | Freq: Four times a day (QID) | ORAL | Status: DC

## 2012-10-11 NOTE — ED Provider Notes (Signed)
  CSN: [id number]     Arrival date & time 10/11/12  1040 History     First MD Initiated Contact with Patient 10/11/12 1102     Chief Complaint  Patient presents with  . Diarrhea   (Consider location/radiation/quality/duration/timing/severity/associated sxs/prior Treatment) Patient is a 22 y.o. female presenting with diarrhea. The history is provided by the patient.  Diarrhea Quality:  Watery Severity:  Mild Onset quality:  Sudden Duration:  2 days Progression:  Partially resolved Associated symptoms: abdominal pain, chills, fever and vomiting   Risk factors: suspect food intake   Risk factors: no sick contacts     Past Medical History  Diagnosis Date  . Headache(784.0) 08/02/2012    Migraines   Past Surgical History  Procedure Laterality Date  . Cesarean section     History reviewed. No pertinent family history. History  Substance Use Topics  . Smoking status: Never Smoker   . Smokeless tobacco: Not on file  . Alcohol Use: Yes     Comment: occ   OB History   Grav Para Term Preterm Abortions TAB SAB Ect Mult Living                 Review of Systems  Constitutional: Positive for fever and chills.  HENT: Negative.   Cardiovascular: Negative.   Gastrointestinal: Positive for vomiting, abdominal pain and diarrhea. Negative for constipation and blood in stool.  Genitourinary: Negative.     Allergies  Review of patient's allergies indicates no known allergies.  Home Medications   Current Outpatient Rx  Name  Route  Sig  Dispense  Refill  . ondansetron (ZOFRAN ODT) 4 MG disintegrating tablet   Oral   Take 1 tablet (4 mg total) by mouth every 8 (eight) hours as needed for nausea.   10 tablet   0   . ondansetron (ZOFRAN) 4 MG tablet   Oral   Take 1 tablet (4 mg total) by mouth every 6 (six) hours. Prn n/v   8 tablet   0    BP 115/82  Pulse 76  Temp(Src) 99.2 F (37.3 C) (Oral)  Resp 18  SpO2 100%  LMP 09/10/2012 Physical Exam  Nursing note and vitals  reviewed. Constitutional: She is oriented to person, place, and time. She appears well-developed and well-nourished.  HENT:  Head: Normocephalic.  Mouth/Throat: Oropharynx is clear and moist.  Neck: Normal range of motion. Neck supple.  Abdominal: Soft. Normal appearance. Bowel sounds are increased. There is generalized tenderness. There is no rigidity, no rebound, no guarding and no CVA tenderness.  Lymphadenopathy:    She has no cervical adenopathy.  Neurological: She is alert and oriented to person, place, and time.  Skin: Skin is warm and dry.    ED Course   Procedures (including critical care time)  Labs Reviewed - No data to display No results found. 1. Gastroenteritis due to norovirus     MDM    Linna Hoff, MD 10/11/12 1153

## 2012-10-11 NOTE — ED Notes (Signed)
Reports n/v/d since 3 a.m 7/14. Pt states that she did recently eat out prior to getting sick but states that no one else became ill.  Pt has not tried any otc meds for symptoms. States two vomiting episodes this a.m. Symptoms are not as severe today.  Denies fever and any other symptoms.

## 2013-01-02 ENCOUNTER — Other Ambulatory Visit: Payer: Self-pay

## 2013-01-04 ENCOUNTER — Emergency Department (HOSPITAL_COMMUNITY)
Admission: EM | Admit: 2013-01-04 | Discharge: 2013-01-05 | Disposition: A | Discharge status: Home / Self Care | Payer: BC Managed Care – PPO | Attending: Emergency Medicine | Admitting: Emergency Medicine

## 2013-01-04 ENCOUNTER — Encounter (HOSPITAL_COMMUNITY): Payer: Self-pay | Admitting: Emergency Medicine

## 2013-01-04 DIAGNOSIS — Z8679 Personal history of other diseases of the circulatory system: Secondary | ICD-10-CM | POA: Insufficient documentation

## 2013-01-04 DIAGNOSIS — O200 Threatened abortion: Secondary | ICD-10-CM

## 2013-01-04 NOTE — ED Notes (Signed)
Lower abd. Pain. Passing clots. Positive preg. Last Sunday via home test.

## 2013-01-05 ENCOUNTER — Emergency Department (HOSPITAL_COMMUNITY): Payer: BC Managed Care – PPO

## 2013-01-05 LAB — URINALYSIS, ROUTINE W REFLEX MICROSCOPIC
Glucose, UA: NEGATIVE mg/dL
Protein, ur: NEGATIVE mg/dL
pH: 6 (ref 5.0–8.0)

## 2013-01-05 LAB — URINE MICROSCOPIC-ADD ON

## 2013-01-05 LAB — POCT I-STAT, CHEM 8
Calcium, Ion: 1.24 mmol/L — ABNORMAL HIGH (ref 1.12–1.23)
Creatinine, Ser: 0.8 mg/dL (ref 0.50–1.10)
Glucose, Bld: 99 mg/dL (ref 70–99)
Hemoglobin: 12.2 g/dL (ref 12.0–15.0)
Sodium: 139 mEq/L (ref 135–145)
TCO2: 20 mmol/L (ref 0–100)

## 2013-01-05 LAB — WET PREP, GENITAL: Yeast Wet Prep HPF POC: NONE SEEN

## 2013-01-05 MED ORDER — OXYCODONE-ACETAMINOPHEN 5-325 MG PO TABS
2.0000 | ORAL_TABLET | Freq: Once | ORAL | Status: AC
  Administered 2013-01-05: 2 via ORAL
  Filled 2013-01-05: qty 2

## 2013-01-05 MED ORDER — OXYCODONE-ACETAMINOPHEN 5-325 MG PO TABS: 1.0000 | ORAL_TABLET | Freq: Four times a day (QID) | ORAL | Status: DC | PRN

## 2013-01-05 NOTE — ED Provider Notes (Signed)
CSN: [id number]     Arrival date & time 01/04/13  2331 History   First MD Initiated Contact with Patient 01/04/13 2358     Chief Complaint  Patient presents with  . Abdominal Pain  . Vaginal Bleeding   HPI  History provided by the patient. Patient is a 22 year old female G2 P1 A1 who presents with complaints of significant vaginal bleeding and abdominal cramps. Patient reports having last normal menstrual period in September with typical bleeding for 3 days. She has not had normal menstrual period since that time. Over the past one week she began having large amounts of vaginal bleeding and cramping. Patient also believes that she may be pregnant and had a long home positive pregnancy test last Sunday. Since that time her symptoms have continued. They feel similar to her previous miscarriage. She denies any fever, chills or sweats. Denies any lightheadedness or near syncope. He has not improved with Tylenol. No other aggravating or alleviating factors. No other associated symptoms.  OB/GYN is Dr. Huntley Dec  Past Medical History  Diagnosis Date  . Headache(784.0) 08/02/2012    Migraines   Past Surgical History  Procedure Laterality Date  . Cesarean section     No family history on file. History  Substance Use Topics  . Smoking status: Never Smoker   . Smokeless tobacco: Not on file  . Alcohol Use: Yes     Comment: occ   OB History   Grav Para Term Preterm Abortions TAB SAB Ect Mult Living                 Review of Systems  Constitutional: Negative for fever, chills and diaphoresis.  Respiratory: Negative for shortness of breath.   Gastrointestinal: Negative for nausea, vomiting and diarrhea.  Genitourinary: Positive for vaginal bleeding, vaginal discharge and vaginal pain. Negative for dysuria, frequency, hematuria and flank pain.  All other systems reviewed and are negative.    Allergies  Review of patient's allergies indicates no known allergies.  Home Medications  No  current outpatient prescriptions on file. BP 126/70  Pulse 77  Temp(Src) 98.2 F (36.8 C) (Oral)  Resp 22  Ht 5\' 2"  (1.575 m)  Wt 220 lb (99.791 kg)  BMI 40.23 kg/m2  SpO2 97%  LMP 11/16/2012 Physical Exam  Nursing note and vitals reviewed. Constitutional: She is oriented to person, place, and time. She appears well-developed and well-nourished. No distress.  HENT:  Head: Normocephalic.  Cardiovascular: Normal rate and regular rhythm.   Pulmonary/Chest: Effort normal and breath sounds normal. No respiratory distress. She has no wheezes. She has no rales.  Abdominal: Soft. There is no tenderness. There is no rebound and no guarding.  Genitourinary:  Chaperone was present. Large amount of blood present in the vaginal vault. No tissue or products of conception. Cervix is closed.  No significant adnexal or uterine mass tenderness. No masses.  Neurological: She is alert and oriented to person, place, and time.  Skin: Skin is warm and dry. No rash noted.  Psychiatric: She has a normal mood and affect. Her behavior is normal.    ED Course  Procedures   Patient seen and evaluated. Does not appear in acute distress. She does appear in mild to moderate discomfort.  Patient with normal H&H. No other concerning findings on testing. No signs of UTI. Ultrasound demonstrates yolk sac within the uterus. No signs of ectopic pregnancy. There are signs concerning for possible abortion. Findings discussed with patient. She has planned followup with Dr.  Tomlin on Tuesday. She will call the office on Monday.   Results for orders placed during the hospital encounter of 01/04/13  WET PREP, GENITAL      Result Value Range   Yeast Wet Prep HPF POC NONE SEEN  NONE SEEN   Trich, Wet Prep NONE SEEN  NONE SEEN   Clue Cells Wet Prep HPF POC FEW (*) NONE SEEN   WBC, Wet Prep HPF POC FEW (*) NONE SEEN  URINALYSIS, ROUTINE W REFLEX MICROSCOPIC      Result Value Range   Color, Urine YELLOW  YELLOW    APPearance CLEAR  CLEAR   Specific Gravity, Urine 1.018  1.005 - 1.030   pH 6.0  5.0 - 8.0   Glucose, UA NEGATIVE  NEGATIVE mg/dL   Hgb urine dipstick LARGE (*) NEGATIVE   Bilirubin Urine NEGATIVE  NEGATIVE   Ketones, ur NEGATIVE  NEGATIVE mg/dL   Protein, ur NEGATIVE  NEGATIVE mg/dL   Urobilinogen, UA 0.2  0.0 - 1.0 mg/dL   Nitrite NEGATIVE  NEGATIVE   Leukocytes, UA NEGATIVE  NEGATIVE  HCG, QUANTITATIVE, PREGNANCY      Result Value Range   hCG, Beta Chain, Quant, S 2896 (*) <5 mIU/mL  URINE MICROSCOPIC-ADD ON      Result Value Range   Squamous Epithelial / LPF RARE  RARE   RBC / HPF TOO NUMEROUS TO COUNT  <3 RBC/hpf   Urine-Other MUCOUS PRESENT    POCT I-STAT, CHEM 8      Result Value Range   Sodium 139  135 - 145 mEq/L   Potassium 3.7  3.5 - 5.1 mEq/L   Chloride 104  96 - 112 mEq/L   BUN 7  6 - 23 mg/dL   Creatinine, Ser 1.61  0.50 - 1.10 mg/dL   Glucose, Bld 99  70 - 99 mg/dL   Calcium, Ion 0.96 (*) 1.12 - 1.23 mmol/L   TCO2 20  0 - 100 mmol/L   Hemoglobin 12.2  12.0 - 15.0 g/dL   HCT 04.5  40.9 - 81.1 %  POCT PREGNANCY, URINE      Result Value Range   Preg Test, Ur POSITIVE (*) NEGATIVE      Imaging Review US Ob Comp Less 14 Wks  01/05/2013   CLINICAL DATA:  Pelvic pain and vaginal bleeding.  EXAM: OBSTETRIC <14 WK [place] AND TRANSVAGINAL OB US  TECHNIQUE: Both transabdominal and transvaginal ultrasound examinations were performed for complete evaluation of the gestation as well as the maternal uterus, adnexal regions, and pelvic cul-de-sac. Transvaginal technique was performed to assess early pregnancy.  COMPARISON:  Pelvic ultrasound performed 08/08/2011  FINDINGS: Intrauterine gestational sac: Visualized/normal in shape. Noted at the lower uterine segment, with a trace amount of fluid noted along the cervical canal.  Yolk sac:  Yes  Embryo:  No  Cardiac Activity: N/A  MSD: 24.6 mm   7 w   4  d                  [place] EDC: 08/20/2013  Maternal uterus/adnexae: The endometrial  echo complex is diffusely heterogeneously in appearance; this is of uncertain significance. The uterus is otherwise unremarkable.  The ovaries are within normal limits. The right ovary measures 3.7 x 1.6 x 2.3 cm, while the left ovary measures 3.2 x 2.4 x 2.5 cm.  Trace free fluid is noted in the pelvic cul-de-sac.  IMPRESSION: Single intrauterine gestational sac noted at the lower uterine segment, measuring 2.5 cm  in mean sac diameter, corresponding to a gestational age of [redacted] weeks 4 days. A yolk sac is seen, but an embryo is not yet visualized. Given trace fluid along the cervical canal, and the position of the gestational sac, findings raise concern for spontaneous abortion in progress. Complex appearance to endometrial echo complex, nonspecific in nature.   Electronically Signed   By: Roanna Raider M.D.   On: 01/05/2013 04:00   US Ob Transvaginal  01/05/2013   CLINICAL DATA:  Pelvic pain and vaginal bleeding.  EXAM: OBSTETRIC <14 WK [place] AND TRANSVAGINAL OB US  TECHNIQUE: Both transabdominal and transvaginal ultrasound examinations were performed for complete evaluation of the gestation as well as the maternal uterus, adnexal regions, and pelvic cul-de-sac. Transvaginal technique was performed to assess early pregnancy.  COMPARISON:  Pelvic ultrasound performed 08/08/2011  FINDINGS: Intrauterine gestational sac: Visualized/normal in shape. Noted at the lower uterine segment, with a trace amount of fluid noted along the cervical canal.  Yolk sac:  Yes  Embryo:  No  Cardiac Activity: N/A  MSD: 24.6 mm   7 w   4  d                  [place] EDC: 08/20/2013  Maternal uterus/adnexae: The endometrial echo complex is diffusely heterogeneously in appearance; this is of uncertain significance. The uterus is otherwise unremarkable.  The ovaries are within normal limits. The right ovary measures 3.7 x 1.6 x 2.3 cm, while the left ovary measures 3.2 x 2.4 x 2.5 cm.  Trace free fluid is noted in the pelvic cul-de-sac.   IMPRESSION: Single intrauterine gestational sac noted at the lower uterine segment, measuring 2.5 cm in mean sac diameter, corresponding to a gestational age of [redacted] weeks 4 days. A yolk sac is seen, but an embryo is not yet visualized. Given trace fluid along the cervical canal, and the position of the gestational sac, findings raise concern for spontaneous abortion in progress. Complex appearance to endometrial echo complex, nonspecific in nature.   Electronically Signed   By: Roanna Raider M.D.   On: 01/05/2013 04:00      MDM   1. Threatened miscarriage in early pregnancy        Angus Seller, PA-C 01/05/13 2155

## 2013-01-06 LAB — GC/CHLAMYDIA PROBE AMP: CT Probe RNA: NEGATIVE

## 2013-01-06 NOTE — ED Provider Notes (Signed)
Medical screening examination/treatment/procedure(s) were performed by non-physician practitioner and as supervising physician I was immediately available for consultation/collaboration.   Nery Kalisz, MD 01/06/13 0029 

## 2013-07-30 ENCOUNTER — Encounter (HOSPITAL_COMMUNITY): Payer: Self-pay | Admitting: Emergency Medicine

## 2013-07-30 ENCOUNTER — Emergency Department (INDEPENDENT_AMBULATORY_CARE_PROVIDER_SITE_OTHER)
Admission: EM | Admit: 2013-07-30 | Discharge: 2013-07-30 | Disposition: A | Discharge status: Home / Self Care | Payer: 59 | Source: Home / Self Care | Attending: Family Medicine | Admitting: Family Medicine

## 2013-07-30 ENCOUNTER — Ambulatory Visit: Payer: Self-pay | Admitting: Medical

## 2013-07-30 DIAGNOSIS — A088 Other specified intestinal infections: Secondary | ICD-10-CM

## 2013-07-30 DIAGNOSIS — Z331 Pregnant state, incidental: Secondary | ICD-10-CM

## 2013-07-30 DIAGNOSIS — A084 Viral intestinal infection, unspecified: Secondary | ICD-10-CM

## 2013-07-30 MED ORDER — ONDANSETRON HCL 4 MG PO TABS: 4.0000 mg | ORAL_TABLET | Freq: Four times a day (QID) | ORAL | Status: DC

## 2013-07-30 NOTE — Discharge Instructions (Signed)
Thank you for coming in today. Drink plenty of water.  Take zofran as needed for vomiting.  Take tylneol for headache, pain or fever as needed.  Follow up with your OBGYN.  If your belly pain worsens, or you have high fever, bad vomiting, blood in your stool or black tarry stool go to the Emergency Room.   Viral Gastroenteritis Viral gastroenteritis is also known as stomach flu. This condition affects the stomach and intestinal tract. It can cause sudden diarrhea and vomiting. The illness typically lasts 3 to 8 days. Most people develop an immune response that eventually gets rid of the virus. While this natural response develops, the virus can make you quite ill. CAUSES  Many different viruses can cause gastroenteritis, such as rotavirus or noroviruses. You can catch one of these viruses by consuming contaminated food or water. You may also catch a virus by sharing utensils or other personal items with an infected person or by touching a contaminated surface. SYMPTOMS  The most common symptoms are diarrhea and vomiting. These problems can cause a severe loss of body fluids (dehydration) and a body salt (electrolyte) imbalance. Other symptoms may include:  Fever.  Headache.  Fatigue.  Abdominal pain. DIAGNOSIS  Your caregiver can usually diagnose viral gastroenteritis based on your symptoms and a physical exam. A stool sample may also be taken to test for the presence of viruses or other infections. TREATMENT  This illness typically goes away on its own. Treatments are aimed at rehydration. The most serious cases of viral gastroenteritis involve vomiting so severely that you are not able to keep fluids down. In these cases, fluids must be given through an intravenous line (IV). HOME CARE INSTRUCTIONS   Drink enough fluids to keep your urine clear or pale yellow. Drink small amounts of fluids frequently and increase the amounts as tolerated.  Ask your caregiver for specific rehydration  instructions.  Avoid:  Foods high in sugar.  Alcohol.  Carbonated drinks.  Tobacco.  Juice.  Caffeine drinks.  Extremely hot or cold fluids.  Fatty, greasy foods.  Too much intake of anything at one time.  Dairy products until 24 to 48 hours after diarrhea stops.  You may consume probiotics. Probiotics are active cultures of beneficial bacteria. They may lessen the amount and number of diarrheal stools in adults. Probiotics can be found in yogurt with active cultures and in supplements.  Wash your hands well to avoid spreading the virus.  Only take over-the-counter or prescription medicines for pain, discomfort, or fever as directed by your caregiver. Do not give aspirin to children. Antidiarrheal medicines are not recommended.  Ask your caregiver if you should continue to take your regular prescribed and over-the-counter medicines.  Keep all follow-up appointments as directed by your caregiver. SEEK IMMEDIATE MEDICAL CARE IF:   You are unable to keep fluids down.  You do not urinate at least once every 6 to 8 hours.  You develop shortness of breath.  You notice blood in your stool or vomit. This may look like coffee grounds.  You have abdominal pain that increases or is concentrated in one small area (localized).  You have persistent vomiting or diarrhea.  You have a fever.  The patient is a child younger than 3 months, and he or she has a fever.  The patient is a child older than 3 months, and he or she has a fever and persistent symptoms.  The patient is a child older than 3 months, and he or  she has a fever and symptoms suddenly get worse.  The patient is a baby, and he or she has no tears when crying. MAKE SURE YOU:   Understand these instructions.  Will watch your condition.  Will get help right away if you are not doing well or get worse. Document Released: 02/13/2005 Document Revised: 05/08/2011 Document Reviewed: 11/30/2010 [facility] Patient  Information 2014 [place], [place].

## 2013-07-30 NOTE — ED Provider Notes (Signed)
Mary Whitehead is a 23 y.o. female who presents to Urgent Care today for nausea vomiting diarrhea headache and body aches and chills. Symptoms present for 3 days. No medications tried. She is an appointment with her OB/GYN doctor in 2 days. She is currently about [redacted] weeks pregnant. She is G4P1 with 2 miscarriages. She denies any vaginal bleeding. She feels well otherwise. She is able to eat and drink and urinating normally. Headache is mild. No weakness or numbness or loss of function.   Past Medical History  Diagnosis Date  . Headache(784.0) 08/02/2012    Migraines   History  Substance Use Topics  . Smoking status: Never Smoker   . Smokeless tobacco: Not on file  . Alcohol Use: Yes     Comment: occ   ROS as above Medications: No current facility-administered medications for this encounter.   Current Outpatient Prescriptions  Medication Sig Dispense Refill  . ondansetron (ZOFRAN) 4 MG tablet Take 1 tablet (4 mg total) by mouth every 6 (six) hours.  12 tablet  0    Exam:  BP 118/71  Pulse 87  Temp(Src) 98.6 F (37 C) (Oral)  Resp 18  SpO2 100%  LMP 11/16/2012 Gen: Well NAD HEENT: EOMI,  MMM, PERRLA Lungs: Normal work of breathing. CTABL Heart: RRR no MRG Abd: NABS, Soft. NT, ND no rebound or guarding Exts: Brisk capillary refill, warm and well perfused.  Neuro: Normal balance and gait. Normal coordination.  No results found for this or any previous visit (from the past 24 hour(s)). No results found.  Assessment and Plan: 23 y.o. female with viral gastroenteritis in the setting of early pregnancy. Plan for Zofran and Tylenol. Followup with GYN.  Discussed warning signs or symptoms. Please see discharge instructions. Patient expresses understanding.    Rodolph Bong, MD 07/30/13 [phone]

## 2013-07-30 NOTE — ED Notes (Signed)
Pt c/o cold sx onset yest Sx include HA, v/d, chills, decreased appetite, mild appetite Denies fevers, urinary sx She is 9 weeks preg Alert w/no signs of acute distress.

## 2013-08-01 LAB — OB RESULTS CONSOLE GC/CHLAMYDIA
CHLAMYDIA, DNA PROBE: NEGATIVE
GC PROBE AMP, GENITAL: NEGATIVE

## 2013-08-01 LAB — OB RESULTS CONSOLE HEPATITIS B SURFACE ANTIGEN: Hepatitis B Surface Ag: NEGATIVE

## 2013-08-01 LAB — OB RESULTS CONSOLE RPR: RPR: NONREACTIVE

## 2013-08-01 LAB — OB RESULTS CONSOLE ABO/RH: RH Type: POSITIVE

## 2013-08-01 LAB — OB RESULTS CONSOLE RUBELLA ANTIBODY, IGM: RUBELLA: IMMUNE

## 2013-08-01 LAB — OB RESULTS CONSOLE HIV ANTIBODY (ROUTINE TESTING): HIV: NONREACTIVE

## 2013-08-25 ENCOUNTER — Encounter (HOSPITAL_COMMUNITY): Payer: Self-pay

## 2013-08-25 ENCOUNTER — Inpatient Hospital Stay (HOSPITAL_COMMUNITY)
Admission: AD | Admit: 2013-08-25 | Discharge: 2013-08-25 | Disposition: A | Discharge status: Home / Self Care | Payer: 59 | Source: Ambulatory Visit | Attending: Obstetrics and Gynecology | Admitting: Obstetrics and Gynecology

## 2013-08-25 DIAGNOSIS — F4323 Adjustment disorder with mixed anxiety and depressed mood: Secondary | ICD-10-CM | POA: Diagnosis not present

## 2013-08-25 DIAGNOSIS — R109 Unspecified abdominal pain: Secondary | ICD-10-CM | POA: Insufficient documentation

## 2013-08-25 DIAGNOSIS — O9989 Other specified diseases and conditions complicating pregnancy, childbirth and the puerperium: Principal | ICD-10-CM

## 2013-08-25 DIAGNOSIS — G43909 Migraine, unspecified, not intractable, without status migrainosus: Secondary | ICD-10-CM

## 2013-08-25 DIAGNOSIS — O99891 Other specified diseases and conditions complicating pregnancy: Secondary | ICD-10-CM | POA: Diagnosis not present

## 2013-08-25 DIAGNOSIS — Z98891 History of uterine scar from previous surgery: Secondary | ICD-10-CM

## 2013-08-25 DIAGNOSIS — O99340 Other mental disorders complicating pregnancy, unspecified trimester: Secondary | ICD-10-CM | POA: Diagnosis not present

## 2013-08-25 DIAGNOSIS — O34219 Maternal care for unspecified type scar from previous cesarean delivery: Secondary | ICD-10-CM | POA: Diagnosis not present

## 2013-08-25 DIAGNOSIS — O2620 Pregnancy care for patient with recurrent pregnancy loss, unspecified trimester: Secondary | ICD-10-CM

## 2013-08-25 NOTE — Progress Notes (Signed)
Patient stated that she felt bad this morning and could not eat and could not void for specimen. Encouraged patient to eat small bites and drink small sips of liquids all day. Advised that she should start early and not start her day with hours of an empty stomach, that most often she feels weak and nauseated because she has not eaten, not because she has.

## 2013-08-25 NOTE — Discharge Instructions (Signed)
Pregnancy - First Trimester During sexual intercourse, millions of sperm go into the vagina. Only 1 sperm will penetrate and fertilize the female egg while it is in the Fallopian tube. One week later, the fertilized egg implants into the wall of the uterus. An embryo begins to develop into a baby. At 6 to 8 weeks, the eyes and face are formed and the heartbeat can be seen on ultrasound. At the end of 12 weeks (first trimester), all the baby's organs are formed. Now that you are pregnant, you will want to do everything you can to have a healthy baby. Two of the most important things are to get good prenatal care and follow your caregiver's instructions. Prenatal care is all the medical care you receive before the baby's birth. It is given to prevent, find, and treat problems during the pregnancy and childbirth. PRENATAL EXAMS  During prenatal visits, your weight, blood pressure, and urine are checked. This is done to make sure you are healthy and progressing normally during the pregnancy.  A pregnant woman should gain 25 to 35 pounds during the pregnancy. However, if you are overweight or underweight, your caregiver will advise you regarding your weight.  Your caregiver will ask and answer questions for you.  Blood work, cervical cultures, other necessary tests, and a Pap test are done during your prenatal exams. These tests are done to check on your health and the probable health of your baby. Tests are strongly recommended and done for HIV with your permission. This is the virus that causes AIDS. These tests are done because medicines can be given to help prevent your baby from being born with this infection should you have been infected without knowing it. Blood work is also used to find out your blood type, previous infections, and follow your blood levels (hemoglobin).  Low hemoglobin (anemia) is common during pregnancy. Iron and vitamins are given to help prevent this. Later in the pregnancy, blood  tests for diabetes will be done along with any other tests if any problems develop.  You may need other tests to make sure you and the baby are doing well. CHANGES DURING THE FIRST TRIMESTER  Your body goes through many changes during pregnancy. They vary from person to person. Talk to your caregiver about changes you notice and are concerned about. Changes can include:  Your menstrual period stops.  The egg and sperm carry the genes that determine what you look like. Genes from you and your partner are forming a baby. The female genes determine whether the baby is a boy or a girl.  Your body increases in girth and you may feel bloated.  Feeling sick to your stomach (nauseous) and throwing up (vomiting). If the vomiting is uncontrollable, call your caregiver.  Your breasts will begin to enlarge and become tender.  Your nipples may stick out more and become darker.  The need to urinate more. Painful urination may mean you have a bladder infection.  Tiring easily.  Loss of appetite.  Cravings for certain kinds of food.  At first, you may gain or lose a couple of pounds.  You may have changes in your emotions from day to day (excited to be pregnant or concerned something may go wrong with the pregnancy and baby).  You may have more vivid and strange dreams. HOME CARE INSTRUCTIONS   It is very important to avoid all smoking, alcohol and non-prescribed drugs during your pregnancy. These affect the formation and growth of the baby.  Avoid chemicals while pregnant to ensure the delivery of a healthy infant.  Start your prenatal visits by the 12th week of pregnancy. They are usually scheduled monthly at first, then more often in the last 2 months before delivery. Keep your caregiver's appointments. Follow your caregiver's instructions regarding medicine use, blood and lab tests, exercise, and diet.  During pregnancy, you are providing food for you and your baby. Eat regular, well-balanced  meals. Choose foods such as meat, fish, milk and other low fat dairy products, vegetables, fruits, and whole-grain breads and cereals. Your caregiver will tell you of the ideal weight gain.  You can help morning sickness by keeping soda crackers at the bedside. Eat a couple before arising in the morning. You may want to use the crackers without salt on them.  Eating 4 to 5 small meals rather than 3 large meals a day also may help the nausea and vomiting.  Drinking liquids between meals instead of during meals also seems to help nausea and vomiting.  A physical sexual relationship may be continued throughout pregnancy if there are no other problems. Problems may be early (premature) leaking of amniotic fluid from the membranes, vaginal bleeding, or belly (abdominal) pain.  Exercise regularly if there are no restrictions. Check with your caregiver or physical therapist if you are unsure of the safety of some of your exercises. Greater weight gain will occur in the last 2 trimesters of pregnancy. Exercising will help:  Control your weight.  Keep you in shape.  Prepare you for labor and delivery.  Help you lose your pregnancy weight after you deliver your baby.  Wear a good support or jogging bra for breast tenderness during pregnancy. This may help if worn during sleep too.  Ask when prenatal classes are available. Begin classes when they are offered.  Do not use hot tubs, steam rooms, or saunas.  Wear your seat belt when driving. This protects you and your baby if you are in an accident.  Avoid raw meat, uncooked cheese, cat litter boxes, and soil used by cats throughout the pregnancy. These carry germs that can cause birth defects in the baby.  The first trimester is a good time to visit your dentist for your dental health. Getting your teeth cleaned is okay. Use a softer toothbrush and brush gently during pregnancy.  Ask for help if you have financial, counseling, or nutritional needs  during pregnancy. Your caregiver will be able to offer counseling for these needs as well as refer you for other special needs.  Do not take any medicines or herbs unless told by your caregiver.  Inform your caregiver if there is any mental or physical domestic violence.  Make a list of emergency phone numbers of family, friends, hospital, and police and fire departments.  Write down your questions. Take them to your prenatal visit.  Do not douche.  Do not cross your legs.  If you have to stand for long periods of time, rotate you feet or take small steps in a circle.  You may have more vaginal secretions that may require a sanitary pad. Do not use tampons or scented sanitary pads. MEDICINES AND DRUG USE IN PREGNANCY  Take prenatal vitamins as directed. The vitamin should contain 1 milligram of folic acid. Keep all vitamins out of reach of children. Only a couple vitamins or tablets containing iron may be fatal to a baby or young child when ingested.  Avoid use of all medicines, including herbs, over-the-counter medicines, not  prescribed or suggested by your caregiver. Only take over-the-counter or prescription medicines for pain, discomfort, or fever as directed by your caregiver. Do not use aspirin, ibuprofen, or naproxen unless directed by your caregiver.  Let your caregiver also know about herbs you may be using.  Alcohol is related to a number of birth defects. This includes fetal alcohol syndrome. All alcohol, in any form, should be avoided completely. Smoking will cause low birth rate and premature babies.  Street or illegal drugs are very harmful to the baby. They are absolutely forbidden. A baby born to an addicted mother will be addicted at birth. The baby will go through the same withdrawal an adult does.  Let your caregiver know about any medicines that you have to take and for what reason you take them. SEEK MEDICAL CARE IF:  You have any concerns or worries during your  pregnancy. It is better to call with your questions if you feel they cannot wait, rather than worry about them. SEEK IMMEDIATE MEDICAL CARE IF:   An unexplained oral temperature above 102 F (38.9 C) develops, or as your caregiver suggests.  You have leaking of fluid from the vagina (birth canal). If leaking membranes are suspected, take your temperature and inform your caregiver of this when you call.  There is vaginal spotting or bleeding. Notify your caregiver of the amount and how many pads are used.  You develop a bad smelling vaginal discharge with a change in the color.  You continue to feel sick to your stomach (nauseated) and have no relief from remedies suggested. You vomit blood or coffee ground-like materials.  You lose more than 2 pounds of weight in 1 week.  You gain more than 2 pounds of weight in 1 week and you notice swelling of your face, hands, feet, or legs.  You gain 5 pounds or more in 1 week (even if you do not have swelling of your hands, face, legs, or feet).  You get exposed to [place] measles and have never had them.  You are exposed to fifth disease or chickenpox.  You develop belly (abdominal) pain. Round ligament discomfort is a common non-cancerous (benign) cause of abdominal pain in pregnancy. Your caregiver still must evaluate this.  You develop headache, fever, diarrhea, pain with urination, or shortness of breath.  You fall or are in a car accident or have any kind of trauma.  There is mental or physical violence in your home. Document Released: 02/07/2001 Document Revised: 11/08/2011 Document Reviewed: 12/24/2012 [facility] Patient Information 2015 [place], [place]. This information is not intended to replace advice given to you by your health care provider. Make sure you discuss any questions you have with your health care provider. Pregnancy and Anemia Anemia is a condition in which the concentration of red blood cells or hemoglobin in the blood is  below normal. Hemoglobin is a substance in red blood cells that carries oxygen to the tissues of the body. Anemia results in not enough oxygen reaching these tissues.  Anemia during pregnancy is common because the fetus uses more iron and folic acid as it is developing. Your body may not produce enough red blood cells because of this. Also, during pregnancy, the liquid part of the blood (plasma) increases by about 50%, and the red blood cells increase by only 25%. This lowers the concentration of the red blood cells and creates a natural anemia-like situation.  CAUSES  The most common cause of anemia during pregnancy is not having enough  iron in the body to make red blood cells (iron deficiency anemia). Other causes may include:  Folic acid deficiency.  Vitamin B12 deficiency.  Certain prescription or over-the-counter medicines.  Certain medical conditions or infections that destroy red blood cells.  A low platelet count and bleeding caused by antibodies that go through the placenta to the fetus from the mother's blood. SIGNS AND SYMPTOMS  Mild anemia may not be noticeable. If it becomes severe, symptoms may include:  Tiredness.  Shortness of breath, especially with exercise.  Weakness.  Fainting.  Pale looking skin.  Headaches.  Feeling a fast or irregular heartbeat (palpitations). DIAGNOSIS  The type of anemia is usually diagnosed from your family and medical history and blood tests. TREATMENT  Treatment of anemia during pregnancy depends on the cause of the anemia. Treatment can include:  Supplements of iron, vitamin B12, or folic acid.  A blood transfusion. This may be needed if blood loss is severe.  Hospitalization. This may be needed if there is significant continual blood loss.  Dietary changes. HOME CARE INSTRUCTIONS   Follow your dietitian's or health care provider's dietary recommendations.  Increase your vitamin C intake. This will help the stomach absorb  more iron.  Eat a diet rich in iron. This would include foods such as:  Liver.  Beef.  Whole grain bread.  Eggs.  Dried fruit.  Take iron and vitamins as directed by your health care provider.  Eat green leafy vegetables. These are a good source of folic acid. SEEK MEDICAL CARE IF:   You have frequent or lasting headaches.  You are looking pale.  You are bruising easily. SEEK IMMEDIATE MEDICAL CARE IF:   You have extreme weakness, shortness of breath, or chest pain.  You become dizzy or have trouble concentrating.  You have heavy vaginal bleeding.  You develop a rash.  You have bloody or black, tarry stools.  You faint.  You vomit up blood.  You vomit repeatedly.  You have abdominal pain.  You have a fever or persistent symptoms for more than 2-3 days.  You have a fever and your symptoms suddenly get worse.  You are dehydrated. MAKE SURE YOU:   Understand these instructions.  Will watch your condition.  Will get help right away if you are not doing well or get worse. Document Released: 02/11/2000 Document Revised: 12/04/2012 Document Reviewed: 09/25/2012 [facility] Patient Information 2015 [place], [place]. This information is not intended to replace advice given to you by your health care provider. Make sure you discuss any questions you have with your health care provider.

## 2013-08-25 NOTE — MAU Provider Note (Signed)
History  23 yo G5P1031 @ 12.1 wks presents unannounced c/o cramping since 0400 today. Noted one episode of spotting after she wiped prior to coming to the hospital; states none while in MAU. Reports ongoing nausea for which she takes Zofran w/ good results.   Patient Active Problem List   Diagnosis Date Noted  . Adjustment disorder with mixed anxiety and depressed mood - dx'd at age 23, no meds 08/25/2013  . Migraine, unspecified, without mention of intractable migraine without mention of status migrainosus 08/25/2013  . 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 H/O: C-section in 2011 for FTP beyond 9 cm; desires VBAC 08/25/2013  . Cramping affecting pregnancy, antepartum 08/25/2013    Chief Complaint  Patient presents with  . Abdominal Cramping  . Vaginal Discharge   HPI See above  OB History   Grav Para Term Preterm Abortions TAB SAB Ect Mult Living   5 1 1  3  3   1       Past Medical History  Diagnosis Date  . Headache(784.0) 08/02/2012    Migraines    Past Surgical History  Procedure Laterality Date  . Cesarean section      History reviewed. No pertinent family history.  History  Substance Use Topics  . Smoking status: Never Smoker   . Smokeless tobacco: Not on file  . Alcohol Use: Yes     Comment: occ    Allergies: No Known Allergies  No prescriptions prior to admission    ROS Cramping Physical Exam   Blood pressure 114/68, pulse 83, temperature 98.9 F (37.2 C), temperature source Oral, resp. rate 16, height 5\' 3"  (1.6 m), weight 246 lb 6.4 oz (111.766 kg), last menstrual period 05/27/2012, SpO2 100.00%.  Physical Exam Gen: NAD Lungs: CTAB Abd: gravid, soft, NTND FHR: 160s ED Course  Assessment: Growing uterus; reviewed normal discomforts of pregnancy FHR 160  Plan: Comfort measures Reassurance SAB precautions Keep next OB appt   Sherre ScarletWILLIAMS, KIMBERLY CNM, MS 08/27/2013 1:21 PM

## 2013-08-25 NOTE — MAU Note (Signed)
Patient states she has been having abdominal cramping since about 0400. States with wiping she had a little spotting. Has nausea and is taking Zofran.

## 2013-10-16 ENCOUNTER — Inpatient Hospital Stay (HOSPITAL_COMMUNITY): Payer: 59

## 2013-10-16 ENCOUNTER — Encounter (HOSPITAL_COMMUNITY): Payer: Self-pay | Admitting: *Deleted

## 2013-10-16 ENCOUNTER — Inpatient Hospital Stay (HOSPITAL_COMMUNITY)
Admission: AD | Admit: 2013-10-16 | Discharge: 2013-10-16 | Disposition: A | Discharge status: Home / Self Care | Payer: 59 | Source: Ambulatory Visit | Attending: Obstetrics & Gynecology | Admitting: Obstetrics & Gynecology

## 2013-10-16 DIAGNOSIS — R109 Unspecified abdominal pain: Secondary | ICD-10-CM | POA: Diagnosis present

## 2013-10-16 DIAGNOSIS — A499 Bacterial infection, unspecified: Secondary | ICD-10-CM | POA: Diagnosis not present

## 2013-10-16 DIAGNOSIS — O208 Other hemorrhage in early pregnancy: Secondary | ICD-10-CM

## 2013-10-16 DIAGNOSIS — O34219 Maternal care for unspecified type scar from previous cesarean delivery: Secondary | ICD-10-CM | POA: Diagnosis not present

## 2013-10-16 DIAGNOSIS — O2390 Unspecified genitourinary tract infection in pregnancy, unspecified trimester: Secondary | ICD-10-CM | POA: Insufficient documentation

## 2013-10-16 DIAGNOSIS — N760 Acute vaginitis: Secondary | ICD-10-CM | POA: Insufficient documentation

## 2013-10-16 DIAGNOSIS — O4410 Placenta previa with hemorrhage, unspecified trimester: Secondary | ICD-10-CM | POA: Insufficient documentation

## 2013-10-16 DIAGNOSIS — O26899 Other specified pregnancy related conditions, unspecified trimester: Secondary | ICD-10-CM

## 2013-10-16 DIAGNOSIS — B9689 Other specified bacterial agents as the cause of diseases classified elsewhere: Secondary | ICD-10-CM | POA: Insufficient documentation

## 2013-10-16 DIAGNOSIS — O4412 Placenta previa with hemorrhage, second trimester: Secondary | ICD-10-CM

## 2013-10-16 DIAGNOSIS — Z1389 Encounter for screening for other disorder: Secondary | ICD-10-CM

## 2013-10-16 DIAGNOSIS — N390 Urinary tract infection, site not specified: Secondary | ICD-10-CM | POA: Diagnosis not present

## 2013-10-16 LAB — URINE MICROSCOPIC-ADD ON

## 2013-10-16 LAB — URINALYSIS, ROUTINE W REFLEX MICROSCOPIC
Bilirubin Urine: NEGATIVE
Glucose, UA: NEGATIVE mg/dL
Ketones, ur: >80 mg/dL — AB
Leukocytes, UA: NEGATIVE
Nitrite: NEGATIVE
Protein, ur: NEGATIVE mg/dL
SPECIFIC GRAVITY, URINE: 1.02 (ref 1.005–1.030)
Urobilinogen, UA: 0.2 mg/dL (ref 0.0–1.0)
pH: 6.5 (ref 5.0–8.0)

## 2013-10-16 LAB — WET PREP, GENITAL
Trich, Wet Prep: NONE SEEN
Yeast Wet Prep HPF POC: NONE SEEN

## 2013-10-16 MED ORDER — METRONIDAZOLE 0.75 % VA GEL: 1.0000 | Freq: Two times a day (BID) | VAGINAL | Status: DC

## 2013-10-16 MED ORDER — NITROFURANTOIN MONOHYD MACRO 100 MG PO CAPS: 100.0000 mg | ORAL_CAPSULE | Freq: Two times a day (BID) | ORAL | Status: AC

## 2013-10-16 NOTE — MAU Provider Note (Signed)
Results for orders placed during the hospital encounter of 10/16/13 (from the past 24 hour(s))  WET PREP, GENITAL     Status: Abnormal   Collection Time    10/16/13  1:28 PM      Result Value Ref Range   Yeast Wet Prep HPF POC NONE SEEN  NONE SEEN   Trich, Wet Prep NONE SEEN  NONE SEEN   Clue Cells Wet Prep HPF POC MANY (*) NONE SEEN   WBC, Wet Prep HPF POC FEW (*) NONE SEEN    Consulted with Dr. Sallye OberKulwa UTI and BV+ Macrobid 1 tab bid x 7 days Metrogel 1 PV bid x 5 days US  WNL, size c/w dates, FHT 153, AFI wnl, placenta posterior and above cervical os. Pt currently c/o cramp like ctx Monitor toco    Caidence Higashi, CNM, MSN 10/16/2013. 3:19 PM

## 2013-10-16 NOTE — MAU Note (Signed)
Patient reporting 'contractions' that she rates a 6/10 on numeric scale. V. Standard CNM in department and notified. Order toco. Toco applied and assessing.

## 2013-10-16 NOTE — MAU Provider Note (Signed)
Addendum  No ctx noted, no current vb or lof.  Pt DC to home with pelvic rest instructions FU in the office next week

## 2013-10-16 NOTE — Discharge Instructions (Signed)

## 2013-10-16 NOTE — MAU Note (Signed)
Pt present to mau with c/o abdominal cramping and spotting. Pt states she has a low lying placenta and was told by her providers to be checked if she had any bleeding

## 2013-10-16 NOTE — MAU Provider Note (Signed)
Mary Whitehead is a 23 y.o. G5P1021 at 19.3 weeks presents to MAU c/o cramping since last night and spotting that started this morning, no movement since yesterday.  She has a US reporting a low lying placenta and a history of SAB x2 at 6 & 9 weeks.  She denies lof or sex in the last 48 hours.   History     Patient Active Problem List   Diagnosis Date Noted  . Adjustment disorder with mixed anxiety and depressed mood - dx'd at age 23, no meds 08/25/2013  . Migraine, unspecified, without mention of intractable migraine without mention of status migrainosus 08/25/2013  . 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 H/O: C-section in 2011 for FTP beyond 9 cm; desires VBAC 08/25/2013  . Cramping affecting pregnancy, antepartum 08/25/2013    Chief Complaint  Patient presents with  . Abdominal Pain  . Vaginal Bleeding   HPI  OB History   Grav Para Term Preterm Abortions TAB SAB Ect Mult Living   5 1 1  3  3   1       Past Medical History  Diagnosis Date  . Headache(784.0) 08/02/2012    Migraines    Past Surgical History  Procedure Laterality Date  . Cesarean section      No family history on file.  History  Substance Use Topics  . Smoking status: Never Smoker   . Smokeless tobacco: Not on file  . Alcohol Use: Yes     Comment: occ    Allergies: No Known Allergies  Prescriptions prior to admission  Medication Sig Dispense Refill  . Prenatal Vit-Fe Fumarate-FA (PRENATAL MULTIVITAMIN) TABS tablet Take 1 tablet by mouth daily at 12 noon.        ROS See HPI above, all other systems are negative  Physical Exam   Blood pressure 133/68, pulse 95, temperature 98.9 F (37.2 C), temperature source Oral, resp. rate 18, height 5\' 2"  (1.575 m), weight 248 lb 3.2 oz (112.583 kg), last menstrual period 05/27/2012, SpO2 100.00%.  Physical Exam   Ext:  WNL ABD: Soft, non tender to palpation, no rebound or guarding SVE: WNL,  c/t/h   ED Course  Assessment: IUP at  19.3 weeks Membranes: intact FHR: 174  CTX:  n/a   Plan:  Labs: wetprep, gc/cl OB US  Dameer Speiser, CNM, MSN 10/16/2013. 1:28 PM

## 2013-10-17 LAB — GC/CHLAMYDIA PROBE AMP
CT Probe RNA: NEGATIVE
GC PROBE AMP APTIMA: NEGATIVE

## 2013-11-24 ENCOUNTER — Encounter (HOSPITAL_COMMUNITY): Payer: Self-pay | Admitting: *Deleted

## 2013-11-24 ENCOUNTER — Inpatient Hospital Stay (HOSPITAL_COMMUNITY)
Admission: AD | Admit: 2013-11-24 | Discharge: 2013-11-24 | Disposition: A | Discharge status: Home / Self Care | Payer: 59 | Source: Ambulatory Visit | Attending: Obstetrics and Gynecology | Admitting: Obstetrics and Gynecology

## 2013-11-24 DIAGNOSIS — G43909 Migraine, unspecified, not intractable, without status migrainosus: Secondary | ICD-10-CM | POA: Insufficient documentation

## 2013-11-24 DIAGNOSIS — N949 Unspecified condition associated with female genital organs and menstrual cycle: Secondary | ICD-10-CM | POA: Insufficient documentation

## 2013-11-24 DIAGNOSIS — O9989 Other specified diseases and conditions complicating pregnancy, childbirth and the puerperium: Principal | ICD-10-CM

## 2013-11-24 DIAGNOSIS — O99891 Other specified diseases and conditions complicating pregnancy: Secondary | ICD-10-CM | POA: Diagnosis not present

## 2013-11-24 DIAGNOSIS — R197 Diarrhea, unspecified: Secondary | ICD-10-CM | POA: Insufficient documentation

## 2013-11-24 HISTORY — DX: Anxiety disorder, unspecified: F41.9

## 2013-11-24 HISTORY — DX: Major depressive disorder, single episode, unspecified: F32.9

## 2013-11-24 HISTORY — DX: Depression, unspecified: F32.A

## 2013-11-24 LAB — URINALYSIS, ROUTINE W REFLEX MICROSCOPIC
BILIRUBIN URINE: NEGATIVE
Glucose, UA: NEGATIVE mg/dL
KETONES UR: NEGATIVE mg/dL
Leukocytes, UA: NEGATIVE
Nitrite: NEGATIVE
PROTEIN: NEGATIVE mg/dL
Specific Gravity, Urine: <1.005 — ABNORMAL LOW (ref 1.005–1.030)
UROBILINOGEN UA: 0.2 mg/dL (ref 0.0–1.0)
pH: 7 (ref 5.0–8.0)

## 2013-11-24 LAB — OB RESULTS CONSOLE GC/CHLAMYDIA
CHLAMYDIA, DNA PROBE: NEGATIVE
GC PROBE AMP, GENITAL: NEGATIVE

## 2013-11-24 LAB — WET PREP, GENITAL
Clue Cells Wet Prep HPF POC: NONE SEEN
Trich, Wet Prep: NONE SEEN
YEAST WET PREP: NONE SEEN

## 2013-11-24 LAB — OB RESULTS CONSOLE GBS
GBS: POSITIVE
GBS: POSITIVE
GBS: POSITIVE

## 2013-11-24 LAB — URINE MICROSCOPIC-ADD ON

## 2013-11-24 LAB — FETAL FIBRONECTIN: FETAL FIBRONECTIN: NEGATIVE

## 2013-11-24 MED ORDER — BUTORPHANOL TARTRATE 1 MG/ML IJ SOLN
1.0000 mg | Freq: Once | INTRAMUSCULAR | Status: AC
  Administered 2013-11-24: 1 mg via INTRAVENOUS
  Filled 2013-11-24: qty 1

## 2013-11-24 MED ORDER — METOCLOPRAMIDE HCL 5 MG/ML IJ SOLN
10.0000 mg | Freq: Once | INTRAMUSCULAR | Status: AC
  Administered 2013-11-24: 10 mg via INTRAVENOUS
  Filled 2013-11-24: qty 2

## 2013-11-24 MED ORDER — LACTATED RINGERS IV BOLUS (SEPSIS)
500.0000 mL | Freq: Once | INTRAVENOUS | Status: AC
  Administered 2013-11-24: 500 mL via INTRAVENOUS

## 2013-11-24 MED ORDER — BUTALBITAL-APAP-CAFFEINE 50-325-40 MG PO TABS: 1.0000 | ORAL_TABLET | Freq: Four times a day (QID) | ORAL | Status: DC | PRN

## 2013-11-24 MED ORDER — LACTATED RINGERS IV SOLN
INTRAVENOUS | Status: DC
  Administered 2013-11-24: 10:00:00 via INTRAVENOUS

## 2013-11-24 NOTE — MAU Provider Note (Signed)
History   23 yo G5P1031 at 84 1/7 weeks presented unannounced c/o migraine, diarrhea, and pelvic pressure/cramping.  Felt bad on Friday, fatigued and nauseated, then had onset of temporal/occiptal migraine on Saturday afternoon.  Persisted despite Tylenol and sleep.  Had diarrhea x 24 hours, with last episode last night.  Feeling pelvic pressure and cramping, "like a mild period".  Denies recent IC, leaking, bleeding, fever, dysuria, back pain, or any other sx.  Denies viral exposures.  Hx chronic migraines, with preventive med taken prior to pregnancy.  Has not had migraine since pregnancy began.  Unable to void on arrival.  Patient Active Problem List   Diagnosis Date Noted  . Adjustment disorder with mixed anxiety and depressed mood - dx'd at age 62, no meds 08/25/2013  . Migraine, unspecified, without mention of intractable migraine without mention of status migrainosus 08/25/2013  . 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 H/O: C-section in 2011 for FTP beyond 9 cm; desires VBAC 08/25/2013  . Cramping affecting pregnancy, antepartum 08/25/2013    Chief Complaint  Patient presents with  . Migraine   HPI:  See above  OB History   Grav Para Term Preterm Abortions TAB SAB Ect Mult Living   Past Medical History  Diagnosis Date  . Headache(784.0) 08/02/2012    Migraines    Past Surgical History  Procedure Laterality Date  . Cesarean section      No family history on file.  History  Substance Use Topics  . Smoking status: Never Smoker   . Smokeless tobacco: Not on file  . Alcohol Use: Yes     Comment: occ    Allergies: No Known Allergies  Prescriptions prior to admission  Medication Sig Dispense Refill  . metroNIDAZOLE (METROGEL VAGINAL) 0.75 % vaginal gel Place 1 Applicatorful vaginally 2 (two) times daily.  70 g  0  . Prenatal Vit-Fe Fumarate-FA (PRENATAL MULTIVITAMIN) TABS tablet Take 1 tablet by  mouth daily at 12 noon.        ROS:  Migraine H/A, diarrhea last night, lower abdominal cramping, +FM. Physical Exam   Height  (1.575 m), weight 255 lb (115.667 kg), last menstrual period 05/27/2012.  Physical Exam Appears photophobic due to migraine Chest clear Heart RRR without murmur Abd gravid, NT Pelvic--small amount frothy white d/c in vault.  Cervix closed, long, NT. Ext WNL  FHR Reassuring for EGA UCs--none defined, ? Very mild/sporadic irritability  ED Course  Assessment: IUP at 25 1/7 weeks Migraine Diarrhea Cramping/pelvic pressure.  Plan: IV hydration Stadol and Reglan UA, urine culture FFN GBS, GC/chlamydia, wet prep   Nigel Bridgeman CNM, MSN 11/24/2013 8:50 AM  Addendum: Feeling better after IV hydration, Stadol, and Reglan. HA now down to 4/10 (vs 9/10).   Able to take in fluids without N/V, no further diarrhea.  Results for orders placed during the hospital encounter of 11/24/13 (from the past 24 hour(s))  FETAL FIBRONECTIN     Status: None   Collection Time    11/24/13  9:06 AM      Result Value Ref Range   Fetal Fibronectin NEGATIVE  NEGATIVE  WET PREP, GENITAL     Status: Abnormal   Collection Time    11/24/13  9:06 AM      Result Value Ref Range   Yeast Wet Prep HPF POC NONE SEEN  NONE SEEN  Trich, Wet Prep NONE SEEN  NONE SEEN   Clue Cells Wet Prep HPF POC NONE SEEN  NONE SEEN   WBC, Wet Prep HPF POC FEW (*) NONE SEEN  URINALYSIS, ROUTINE W REFLEX MICROSCOPIC     Status: Abnormal   Collection Time    11/24/13 11:45 AM      Result Value Ref Range   Color, Urine YELLOW  YELLOW   APPearance CLEAR  CLEAR   Specific Gravity, Urine <1.005 (*) 1.005 - 1.030   pH 7.0  5.0 - 8.0   Glucose, UA NEGATIVE  NEGATIVE mg/dL   Hgb urine dipstick TRACE (*) NEGATIVE   Bilirubin Urine NEGATIVE  NEGATIVE   Ketones, ur NEGATIVE  NEGATIVE mg/dL   Protein, ur NEGATIVE  NEGATIVE mg/dL   Urobilinogen, UA 0.2  0.0 - 1.0 mg/dL   Nitrite NEGATIVE   NEGATIVE   Leukocytes, UA NEGATIVE  NEGATIVE  URINE MICROSCOPIC-ADD ON     Status: Abnormal   Collection Time    11/24/13 11:45 AM      Result Value Ref Range   Squamous Epithelial / LPF FEW (*) RARE   RBC / HPF 0-2  <3 RBC/hpf   Bacteria, UA RARE  RARE   Plan: D/C home. Rx Fioricet, #36, 2 refills. Other migraine comfort measures discussed. S/S PTL reviewed. Note for OOW today. Keep scheduled appt at [facility], call prn.  Nigel Bridgeman, CNM 11/24/13 12:50p

## 2013-11-24 NOTE — Discharge Instructions (Signed)

## 2013-11-24 NOTE — MAU Note (Addendum)
States Had diarrhea since Saturday, none since last night. States migraine started last night. Took Tylenol last night with no relief. States she has a history of bad migraines and usually has to take medication everyday to prevent them. States she has not been able to take the medication during pregnancy, but has not had a migraine during pregnancy until now. States she feels some stomach cramps. States is unable to void for urine specimen at this time.

## 2013-11-25 LAB — URINE CULTURE: Colony Count: >=100000

## 2013-11-25 LAB — CULTURE, BETA STREP (GROUP B ONLY)

## 2013-11-25 LAB — GC/CHLAMYDIA PROBE AMP
CT Probe RNA: NEGATIVE
GC Probe RNA: NEGATIVE

## 2013-12-29 ENCOUNTER — Encounter (HOSPITAL_COMMUNITY): Payer: Self-pay | Admitting: *Deleted

## 2014-01-26 ENCOUNTER — Inpatient Hospital Stay (HOSPITAL_COMMUNITY): Payer: 59

## 2014-01-26 ENCOUNTER — Inpatient Hospital Stay (HOSPITAL_COMMUNITY)
Admission: AD | Admit: 2014-01-26 | Discharge: 2014-01-27 | DRG: 781 | Disposition: A | Discharge status: Home / Self Care | Payer: 59 | Source: Ambulatory Visit | Attending: Obstetrics and Gynecology | Admitting: Obstetrics and Gynecology

## 2014-01-26 ENCOUNTER — Encounter (HOSPITAL_COMMUNITY): Payer: Self-pay

## 2014-01-26 DIAGNOSIS — Z809 Family history of malignant neoplasm, unspecified: Secondary | ICD-10-CM

## 2014-01-26 DIAGNOSIS — Z3A34 34 weeks gestation of pregnancy: Secondary | ICD-10-CM | POA: Diagnosis present

## 2014-01-26 DIAGNOSIS — B951 Streptococcus, group B, as the cause of diseases classified elsewhere: Secondary | ICD-10-CM

## 2014-01-26 DIAGNOSIS — F419 Anxiety disorder, unspecified: Secondary | ICD-10-CM | POA: Diagnosis present

## 2014-01-26 DIAGNOSIS — IMO0002 Reserved for concepts with insufficient information to code with codable children: Secondary | ICD-10-CM

## 2014-01-26 DIAGNOSIS — O99213 Obesity complicating pregnancy, third trimester: Secondary | ICD-10-CM | POA: Diagnosis present

## 2014-01-26 DIAGNOSIS — O4700 False labor before 37 completed weeks of gestation, unspecified trimester: Secondary | ICD-10-CM | POA: Insufficient documentation

## 2014-01-26 DIAGNOSIS — O2623 Pregnancy care for patient with recurrent pregnancy loss, third trimester: Secondary | ICD-10-CM | POA: Diagnosis present

## 2014-01-26 DIAGNOSIS — O3421 Maternal care for scar from previous cesarean delivery: Secondary | ICD-10-CM | POA: Diagnosis present

## 2014-01-26 DIAGNOSIS — Z833 Family history of diabetes mellitus: Secondary | ICD-10-CM | POA: Diagnosis not present

## 2014-01-26 DIAGNOSIS — G43909 Migraine, unspecified, not intractable, without status migrainosus: Secondary | ICD-10-CM | POA: Diagnosis present

## 2014-01-26 DIAGNOSIS — O99820 Streptococcus B carrier state complicating pregnancy: Secondary | ICD-10-CM | POA: Diagnosis present

## 2014-01-26 DIAGNOSIS — F329 Major depressive disorder, single episode, unspecified: Secondary | ICD-10-CM | POA: Diagnosis present

## 2014-01-26 DIAGNOSIS — Z8249 Family history of ischemic heart disease and other diseases of the circulatory system: Secondary | ICD-10-CM

## 2014-01-26 DIAGNOSIS — O2620 Pregnancy care for patient with recurrent pregnancy loss, unspecified trimester: Secondary | ICD-10-CM

## 2014-01-26 LAB — URINALYSIS, ROUTINE W REFLEX MICROSCOPIC
BILIRUBIN URINE: NEGATIVE
Glucose, UA: NEGATIVE mg/dL
KETONES UR: NEGATIVE mg/dL
Leukocytes, UA: NEGATIVE
NITRITE: NEGATIVE
PH: 6 (ref 5.0–8.0)
Protein, ur: NEGATIVE mg/dL
Specific Gravity, Urine: 1.01 (ref 1.005–1.030)
Urobilinogen, UA: 0.2 mg/dL (ref 0.0–1.0)

## 2014-01-26 LAB — URINE MICROSCOPIC-ADD ON

## 2014-01-26 MED ORDER — SODIUM CHLORIDE 0.9 % IJ SOLN: 3.0000 mL | Freq: Two times a day (BID) | INTRAMUSCULAR | Status: DC

## 2014-01-26 MED ORDER — PRENATAL MULTIVITAMIN CH: 1.0000 | ORAL_TABLET | Freq: Every day | ORAL | Status: DC

## 2014-01-26 MED ORDER — ACETAMINOPHEN 325 MG PO TABS: 650.0000 mg | ORAL_TABLET | ORAL | Status: DC | PRN

## 2014-01-26 MED ORDER — ZOLPIDEM TARTRATE 5 MG PO TABS
5.0000 mg | ORAL_TABLET | Freq: Every evening | ORAL | Status: DC | PRN
  Administered 2014-01-26: 5 mg via ORAL
  Filled 2014-01-26: qty 1

## 2014-01-26 MED ORDER — SODIUM CHLORIDE 0.9 % IJ SOLN: 3.0000 mL | INTRAMUSCULAR | Status: DC | PRN

## 2014-01-26 MED ORDER — SODIUM CHLORIDE 0.9 % IV SOLN: 250.0000 mL | INTRAVENOUS | Status: DC | PRN

## 2014-01-26 MED ORDER — DOCUSATE SODIUM 100 MG PO CAPS: 100.0000 mg | ORAL_CAPSULE | Freq: Every day | ORAL | Status: DC

## 2014-01-26 MED ORDER — CALCIUM CARBONATE ANTACID 500 MG PO CHEW: 2.0000 | CHEWABLE_TABLET | ORAL | Status: DC | PRN

## 2014-01-26 NOTE — Plan of Care (Signed)
Problem: Consults Goal: Antepartum Patient Education Outcome: Completed/Met Date Met:  01/26/14 Goal: Orientation to unit: [facility] (smoking, visitation, chaplain services, helpline) Outcome: Completed/Met Date Met:  01/26/14  Problem: Phase II Progression Outcomes Goal: Electronic fetal monitoring(Doppler,Continuous,Intermittent) EFM (Doppler, Continuous, Intermittent) Outcome: Completed/Met Date Met:  01/26/14

## 2014-01-26 NOTE — MAU Note (Signed)
Assumed care of patient.

## 2014-01-26 NOTE — MAU Note (Signed)
Urine in lab 

## 2014-01-26 NOTE — MAU Note (Signed)
Pt states was seen at office for routine appt. Had noted decreased fm yesterday, and was put on monitors at MD office. Told that fetal hr was too high, and was sent to MAU. Denies bleeding. Does have more vaginal discharge than usual.

## 2014-01-26 NOTE — H&P (Signed)
23 yo G5P1031 at 34 1/7 weeks presented from office for further monitoring due to fetal tachycardia on NST.  Had noted decreased FM, with NST reactive but episodes of FHR to 170-180, occasional contractions.  Denies leaking or bleeding, now noting FM.    Patient Active Problem List   Diagnosis Date Noted  . Fetal tachycardia 01/26/2014  . Fetal heart rate nonreactive 01/26/2014  . Morbid obesity 01/26/2014  . Positive GBS test 01/26/2014  . Pregnancy complicated by previous recurrent miscarriages 01/26/2014  . Adjustment disorder with mixed anxiety and depressed mood - dx'd at age 23, no meds 08/25/2013  . Migraine, unspecified, without mention of intractable migraine without mention of status migrainosus 08/25/2013  . 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 H/O: C-section in 2011 for FTP beyond 9 cm; desires VBAC 08/25/2013  . Cramping affecting pregnancy, antepartum 08/25/2013    History of present pregnancy: Patient entered care at 10 1/7 weeks.   EDC of 03/08/14 was established by US at 9 3/7 weeks.   Anatomy scan:  19 2/7 weeks, with normal findings and an low lying placenta.   Additional US evaluations:   15 3/7 weeks for pain:  Normal findings, cervix WNL. 27 2/7 weeks, f/u on LL placenta:  LLP resolved, breech, EFW 1241 gm..   Significant prenatal events: Cramping with urination at 15 weeks, UA negative, no other significant findings.  Sciatica noted at 23 weeks.  Had LL placenta, resolved by 27 weeks.  Increased depression at 30 2/7 weeks.  [facility], but patient declined.  Discussed hope for VBAC.  Depression sx improved by 31 weeks.  GBS noted on vaginal culture 11/24/13.  Elevated 1 hour GTT, but normal 3 hour GTT.  Had normal 1st trimester screen, declined AFP. Last evaluation:  Today, with NST for decreased FM.  Chief Complaint  Patient presents with  . Contractions  . Non-stress Test    HPI:  See above  OB History    Gravida Para Term Preterm AB TAB SAB Ectopic Multiple Living   5 1 1  3  3   1     2011--Primary LTCS due to FTP, 9 cm, 40 5/7 weeks, 20 hour labor, 8+13, female, Dr. Arelia SneddonMcComb 2012--SAB at 6 weeks 2013--SAB at 8 weeks 12/2012--SAB at 6 weeks.  Past Medical History  Diagnosis Date  . Headache(784.0) 08/02/2012    Migraines  . Anxiety   . Depression     Past Surgical History  Procedure Laterality Date  . Cesarean section    . Wisdom tooth extraction      Family History  Problem Relation Age of Onset  . Diabetes Father   . Hypertension Father   . Cancer Father   . Cancer Maternal Grandmother   . Diabetes Paternal Grandmother   . Hypertension Paternal Grandmother     History  Substance Use Topics  . Smoking status: Never Smoker   . Smokeless tobacco: Never Used  . 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  Allergies: No Known Allergies  Prescriptions prior to admission  Medication Sig Dispense Refill Last Dose  . Prenatal Vit-Fe Fumarate-FA (PRENATAL MULTIVITAMIN) TABS tablet Take 1 tablet by mouth daily at 12 noon.   01/25/2014 at Unknown time  . butalbital-acetaminophen-caffeine (FIORICET) 50-325-40 MG per tablet Take 1-2 tablets by mouth every 6 (  six) hours as needed for headache. (Patient not taking: Reported on 01/26/2014) 36 tablet 2     ROS:  Occasional contractions, +FM Physical Exam   Blood pressure 103/49, pulse 97, temperature 98.1 F (36.7 C), temperature source Oral, resp. rate 20, height 5\' 2"  (1.575 m), weight 260 lb (117.935 kg), SpO2 98 %.  Physical Exam  Chest clear Heart RRR without murmur Abd gravid, NT Pelvic--closed, long, PP OOP, vtx to US. Ext WNL  FHR reassuring, but not classically reactive UCs occasional, mild  ED Course  Assessment: IUP at 34 1/7 weeks  Likely broad FHR accels on NST at  office  Plan: Observe on monitor at present BPP/AFI  Nigel BridgemanLATHAM, Bertram Haddix CNM, MSN 01/26/2014 9:09 PM   Addendum: Returned from US:  BPP 8/8, normal fluid, AFI 14.81, 53%ile, vtx.  FHR currently non-reactive, with 2 spontaneous decels noted, one followed mild UC. FHR currently with decreased variability after decel.  Consulted with Dr. Stefano GaulStringer: Will admit for observation Continuous EFM. Reassess FHR through night to determine if repeat BPP would be appropriate tomorrow.  Nigel BridgemanVicki Nahima Ales, CNM 01/26/14 7:20p

## 2014-01-26 NOTE — MAU Provider Note (Signed)
History   23 yo G5P1031 at 4734 1/7 weeks presented from office for further monitoring due to fetal tachycardia on NST.  Had noted decreased FM, with NST reactive but episodes of FHR to 170-180, occasional contractions.  Denies leaking or bleeding, now noting FM.    Mary Whitehead Active Problem List   Diagnosis Date Noted  . Fetal tachycardia 01/26/2014  . Fetal heart rate nonreactive 01/26/2014  . Morbid obesity 01/26/2014  . Positive GBS test 01/26/2014  . Pregnancy complicated by previous recurrent miscarriages 01/26/2014  . Adjustment disorder with mixed anxiety and depressed mood - dx'd at age 23, no meds 08/25/2013  . Migraine, unspecified, without mention of intractable migraine without mention of status migrainosus 08/25/2013  . 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 H/O: C-section in 2011 for FTP beyond 9 cm; desires VBAC 08/25/2013  . Cramping affecting pregnancy, antepartum 08/25/2013    History of present pregnancy: Mary Whitehead entered care at 10 1/7 weeks.   EDC of 03/08/14 was established by US at 9 3/7 weeks.   Anatomy scan:  19 2/7 weeks, with normal findings and an low lying placenta.   Additional US evaluations:   15 3/7 weeks for pain:  Normal findings, cervix WNL. 27 2/7 weeks, f/u on LL placenta:  LLP resolved, breech, EFW 1241 gm..   Significant prenatal events: Cramping with urination at 15 weeks, UA negative, no other significant findings.  Sciatica noted at 23 weeks.  Had LL placenta, resolved by 27 weeks.  Increased depression at 30 2/7 weeks.  Offered referral to [facility] professional and med Rx, but Mary Whitehead declined.  Discussed hope for VBAC.  Depression sx improved by 31 weeks.  GBS noted on vaginal culture 11/24/13.  Elevated 1 hour GTT, but normal 3 hour GTT.  Had normal 1st trimester screen, declined AFP. Last evaluation:  Today, with NST for decreased FM.  Chief Complaint  Mary Whitehead presents with  . Contractions  . Non-stress  Test   HPI:  See above  OB History    Gravida Para Term Preterm AB TAB SAB Ectopic Multiple Living   5 1 1  3  3   1     2011--Primary LTCS due to FTP, 9 cm, 40 5/7 weeks, 20 hour labor, 8+13, female, Dr. Arelia SneddonMcComb 2012--SAB at 6 weeks 2013--SAB at 8 weeks 12/2012--SAB at 6 weeks.  Past Medical History  Diagnosis Date  . Headache(784.0) 08/02/2012    Migraines  . Anxiety   . Depression     Past Surgical History  Procedure Laterality Date  . Cesarean section    . Wisdom tooth extraction      Family History  Problem Relation Age of Onset  . Diabetes Father   . Hypertension Father   . Cancer Father   . Cancer Maternal Grandmother   . Diabetes Paternal Grandmother   . Hypertension Paternal Grandmother     History  Substance Use Topics  . Smoking status: Never Smoker   . Smokeless tobacco: Never Used  . 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  Allergies: No Known Allergies  Prescriptions prior to admission  Medication Sig Dispense Refill Last Dose  . Prenatal Vit-Fe Fumarate-FA (PRENATAL MULTIVITAMIN) TABS tablet Take 1 tablet by mouth daily at 12 noon.   01/25/2014 at Unknown time  . butalbital-acetaminophen-caffeine (FIORICET) 50-325-40 MG per tablet Take 1-2 tablets by  mouth every 6 (six) hours as needed for headache. (Mary Whitehead not taking: Reported on 01/26/2014) 36 tablet 2     ROS:  Occasional contractions, +FM Physical Exam   Blood pressure 103/49, pulse 97, temperature 98.1 F (36.7 C), temperature source Oral, resp. rate 20, height 5\' 2"  (1.575 m), weight 260 lb (117.935 kg), SpO2 98 %.  Physical Exam  Chest clear Heart RRR without murmur Abd gravid, NT Pelvic--closed, long, PP OOP, vtx to US. Ext WNL  FHR reassuring, but not classically reactive UCs occasional, mild  ED Course  Assessment: IUP at 34 1/7 weeks  Likely broad FHR accels on NST at  office  Plan: Observe on monitor at present BPP/AFI  Nigel BridgemanLATHAM, VICKI CNM, MSN 01/26/2014 9:09 PM   Addendum: Returned from US:  BPP 8/8, normal fluid, AFI 14.81, 53%ile, vtx.  FHR currently non-reactive, with 2 spontaneous decels noted, one followed mild UC. FHR currently with decreased variability after decel.  Consulted with Dr. Stefano GaulStringer: Will admit for observation Continuous EFM. Reassess FHR through night to determine if repeat BPP would be appropriate tomorrow.  Nigel BridgemanVicki Latham, CNM 01/26/14 7:20p

## 2014-01-26 NOTE — MAU Note (Signed)
Patient states she was seen in the office today for a regular visit. States she was monitored and was having contractions and the baby's heart rate was hight. Denies bleeding or leaking. Patient reports irregular contractions and less than usual movement.

## 2014-01-27 DIAGNOSIS — O4700 False labor before 37 completed weeks of gestation, unspecified trimester: Secondary | ICD-10-CM | POA: Insufficient documentation

## 2014-01-27 NOTE — Progress Notes (Addendum)
S: In to check on pt due to notable contractions during the night; ctxs she states she could feel, but tolerable. Denies ctxs at present. Was able to get some sleep with dose of Ambien. Reports +FM. Denies VB or LOF.  O: VSS, afebrile BL FHR 135 w/ mod variability, +accels, occ variables (improved w/ position change) Irregular, mild ctxs, some irritability Pelvic: Long/thick/closed Good uop  A: 23 yo G5P1031 @ 34.2 wks admitted for observation due to 2 late decels while in MAU last evening, followed by ctxs. Reassuring FHRT for this GA  P: Second BPP not indicated at this time; Dr. Stefano GaulStringer concurs. BPP last evening 8/8, AFI 14.81 cm (53rd%), cephalic presentation D/C home today after breakfast per Dr. Stefano GaulStringer Routine OB office f/u   Sherre ScarletKimberly Gilbert [place], CNM 01/27/14, 6:24 AM

## 2014-01-27 NOTE — Discharge Instructions (Signed)
Fetal Movement Counts °Patient Name: __________________________________________________ Patient Due Date: ____________________ °Performing a fetal movement count is highly recommended in high-risk pregnancies, but it is good for every pregnant woman to do. Your health care provider may ask you to start counting fetal movements at 28 weeks of the pregnancy. Fetal movements often increase: °· After eating a full meal. °· After physical activity. °· After eating or drinking something sweet or cold. °· At rest. °Pay attention to when you feel the baby is most active. This will help you notice a pattern of your baby's sleep and wake cycles and what factors contribute to an increase in fetal movement. It is important to perform a fetal movement count at the same time each day when your baby is normally most active.  °HOW TO COUNT FETAL MOVEMENTS °1. Find a quiet and comfortable area to sit or lie down on your left side. Lying on your left side provides the best blood and oxygen circulation to your baby. °2. Write down the day and time on a sheet of paper or in a journal. °3. Start counting kicks, flutters, swishes, rolls, or jabs in a 2-hour period. You should feel at least 10 movements within 2 hours. °4. If you do not feel 10 movements in 2 hours, wait 2-3 hours and count again. Look for a change in the pattern or not enough counts in 2 hours. °SEEK MEDICAL CARE IF: °· You feel less than 10 counts in 2 hours, tried twice. °· There is no movement in over an hour. °· The pattern is changing or taking longer each day to reach 10 counts in 2 hours. °· You feel the baby is not moving as he or she usually does. °Date: ____________ Movements: ____________ Start time: ____________ Finish time: ____________  °Date: ____________ Movements: ____________ Start time: ____________ Finish time: ____________ °Date: ____________ Movements: ____________ Start time: ____________ Finish time: ____________ °Date: ____________ Movements:  ____________ Start time: ____________ Finish time: ____________ °Date: ____________ Movements: ____________ Start time: ____________ Finish time: ____________ °Date: ____________ Movements: ____________ Start time: ____________ Finish time: ____________ °Date: ____________ Movements: ____________ Start time: ____________ Finish time: ____________ °Date: ____________ Movements: ____________ Start time: ____________ Finish time: ____________  °Date: ____________ Movements: ____________ Start time: ____________ Finish time: ____________ °Date: ____________ Movements: ____________ Start time: ____________ Finish time: ____________ °Date: ____________ Movements: ____________ Start time: ____________ Finish time: ____________ °Date: ____________ Movements: ____________ Start time: ____________ Finish time: ____________ °Date: ____________ Movements: ____________ Start time: ____________ Finish time: ____________ °Date: ____________ Movements: ____________ Start time: ____________ Finish time: ____________ °Date: ____________ Movements: ____________ Start time: ____________ Finish time: ____________  °Date: ____________ Movements: ____________ Start time: ____________ Finish time: ____________ °Date: ____________ Movements: ____________ Start time: ____________ Finish time: ____________ °Date: ____________ Movements: ____________ Start time: ____________ Finish time: ____________ °Date: ____________ Movements: ____________ Start time: ____________ Finish time: ____________ °Date: ____________ Movements: ____________ Start time: ____________ Finish time: ____________ °Date: ____________ Movements: ____________ Start time: ____________ Finish time: ____________ °Date: ____________ Movements: ____________ Start time: ____________ Finish time: ____________  °Date: ____________ Movements: ____________ Start time: ____________ Finish time: ____________ °Date: ____________ Movements: ____________ Start time: ____________ Finish  time: ____________ °Date: ____________ Movements: ____________ Start time: ____________ Finish time: ____________ °Date: ____________ Movements: ____________ Start time: ____________ Finish time: ____________ °Date: ____________ Movements: ____________ Start time: ____________ Finish time: ____________ °Date: ____________ Movements: ____________ Start time: ____________ Finish time: ____________ °Date: ____________ Movements: ____________ Start time: ____________ Finish time: ____________  °Date: ____________ Movements: ____________ Start time: ____________ Finish   time: ____________ Date: ____________ Movements: ____________ Start time: ____________ Mary Whitehead time: ____________ Date: ____________ Movements: ____________ Start time: ____________ Mary Whitehead time: ____________ Date: ____________ Movements: ____________ Start time: ____________ Mary Whitehead time: ____________ Date: ____________ Movements: ____________ Start time: ____________ Mary Whitehead time: ____________ Date: ____________ Movements: ____________ Start time: ____________ Mary Whitehead time: ____________ Date: ____________ Movements: ____________ Start time: ____________ Mary Whitehead time: ____________  Date: ____________ Movements: ____________ Start time: ____________ Mary Whitehead time: ____________ Date: ____________ Movements: ____________ Start time: ____________ Mary Whitehead time: ____________ Date: ____________ Movements: ____________ Start time: ____________ Mary Whitehead time: ____________ Date: ____________ Movements: ____________ Start time: ____________ Mary Whitehead time: ____________ Date: ____________ Movements: ____________ Start time: ____________ Mary Whitehead time: ____________ Date: ____________ Movements: ____________ Start time: ____________ Mary Whitehead time: ____________ Date: ____________ Movements: ____________ Start time: ____________ Mary Whitehead time: ____________  Date: ____________ Movements: ____________ Start time: ____________ Mary Whitehead time: ____________ Date: ____________  Movements: ____________ Start time: ____________ Mary Whitehead time: ____________ Date: ____________ Movements: ____________ Start time: ____________ Mary Whitehead time: ____________ Date: ____________ Movements: ____________ Start time: ____________ Mary Whitehead time: ____________ Date: ____________ Movements: ____________ Start time: ____________ Mary Whitehead time: ____________ Date: ____________ Movements: ____________ Start time: ____________ Mary Whitehead time: ____________ Date: ____________ Movements: ____________ Start time: ____________ Mary Whitehead time: ____________  Date: ____________ Movements: ____________ Start time: ____________ Mary Whitehead time: ____________ Date: ____________ Movements: ____________ Start time: ____________ Mary Whitehead time: ____________ Date: ____________ Movements: ____________ Start time: ____________ Mary Whitehead time: ____________ Date: ____________ Movements: ____________ Start time: ____________ Mary Whitehead time: ____________ Date: ____________ Movements: ____________ Start time: ____________ Mary Whitehead time: ____________ Date: ____________ Movements: ____________ Start time: ____________ Mary Whitehead time: ____________ Document Released: 03/15/2006 Document Revised: 06/30/2013 Document Reviewed: 12/11/2011 ExitCare Patient Information 2015 [employer]. This information is not intended to replace advice given to you by your health care provider. Make sure you discuss any questions you have with your health care provider. Third Trimester of Pregnancy The third trimester is from week 29 through week 42, months 7 through 9. This trimester is when your unborn baby (fetus) is growing very fast. At the end of the ninth month, the unborn baby is about 20 inches in length. It weighs about 6-10 pounds.  HOME CARE   Avoid all smoking, herbs, and alcohol. Avoid drugs not approved by your doctor.  Only take medicine as told by your doctor. Some medicines are safe and some are not during pregnancy.  Exercise only as told by your  doctor. Stop exercising if you start having cramps.  Eat regular, healthy meals.  Wear a good support bra if your breasts are tender.  Do not use hot tubs, steam rooms, or saunas.  Wear your seat belt when driving.  Avoid raw meat, uncooked cheese, and liter boxes and soil used by cats.  Take your prenatal vitamins.  Try taking medicine that helps you poop (stool softener) as needed, and if your doctor approves. Eat more fiber by eating fresh fruit, vegetables, and whole grains. Drink enough fluids to keep your pee (urine) clear or pale yellow.  Take warm water baths (sitz baths) to soothe pain or discomfort caused by hemorrhoids. Use hemorrhoid cream if your doctor approves.  If you have puffy, bulging veins (varicose veins), wear support hose. Raise (elevate) your feet for 15 minutes, 3-4 times a day. Limit salt in your diet.  Avoid heavy lifting, wear low heels, and sit up straight.  Rest with your legs raised if you have leg cramps or low back pain.  Visit your dentist if you have not gone during your pregnancy. Use a soft toothbrush to brush  your teeth. Be gentle when you floss. °· You can have sex (intercourse) unless your doctor tells you not to. °· Do not travel far distances unless you must. Only do so with your doctor's approval. °· Take prenatal classes. °· Practice driving to the hospital. °· Pack your hospital bag. °· Prepare the baby's room. °· Go to your doctor visits. °GET HELP IF: °5. You are not sure if you are in labor or if your water has broken. °6. You are dizzy. °7. You have mild cramps or pressure in your lower belly (abdominal). °8. You have a nagging pain in your belly area. °9. You continue to feel sick to your stomach (nauseous), throw up (vomit), or have watery poop (diarrhea). °10. You have bad smelling fluid coming from your vagina. °11. You have pain with peeing (urination). °GET HELP RIGHT AWAY IF:  °· You have a fever. °· You are leaking fluid from your  vagina. °· You are spotting or bleeding from your vagina. °· You have severe belly cramping or pain. °· You lose or gain weight rapidly. °· You have trouble catching your breath and have chest pain. °· You notice sudden or extreme puffiness (swelling) of your face, hands, ankles, feet, or legs. °· You have not felt the baby move in over an hour. °· You have severe headaches that do not go away with medicine. °· You have vision changes. °Document Released: 05/10/2009 Document Revised: 06/10/2012 Document Reviewed: 04/16/2012 °ExitCare® Patient Information ©2015 [employer]. This information is not intended to replace advice given to you by your health care provider. Make sure you discuss any questions you have with your health care provider. ° °

## 2014-01-27 NOTE — Discharge Summary (Signed)
Physician Discharge Summary  Patient ID: Mary Whitehead MRN: [id number] DOB/AGE: 1990-08-26 23 y.o.  Admit date: 01/26/2014 Discharge date: 01/27/2014  Admission Diagnoses: Prolonged FM for Fetal Tachycardia  Discharge Diagnoses:  Active Problems:   Fetal tachycardia   Fetal heart rate nonreactive   Morbid obesity   Positive GBS test   Pregnancy complicated by previous recurrent miscarriages   Discharged Condition: stable  Hospital Course: BPP, Extended FM, Ambien at HS  Consults: None  Significant Diagnostic Studies: BPP 6/8 (-2 for FB), AFI 14.81cm Results for orders placed or performed during the hospital encounter of 01/26/14 (from the past 24 hour(s))  Urinalysis, Routine w reflex microscopic     Status: Abnormal   Collection Time: 01/26/14  3:45 PM  Result Value Ref Range   Color, Urine YELLOW YELLOW   APPearance CLEAR CLEAR   Specific Gravity, Urine 1.010 1.005 - 1.030   pH 6.0 5.0 - 8.0   Glucose, UA NEGATIVE NEGATIVE mg/dL   Hgb urine dipstick TRACE (A) NEGATIVE   Bilirubin Urine NEGATIVE NEGATIVE   Ketones, ur NEGATIVE NEGATIVE mg/dL   Protein, ur NEGATIVE NEGATIVE mg/dL   Urobilinogen, UA 0.2 0.0 - 1.0 mg/dL   Nitrite NEGATIVE NEGATIVE   Leukocytes, UA NEGATIVE NEGATIVE  Urine microscopic-add on     Status: Abnormal   Collection Time: 01/26/14  3:45 PM  Result Value Ref Range   Squamous Epithelial / LPF FEW (A) RARE   WBC, UA 0-2 <3 WBC/hpf   RBC / HPF 0-2 <3 RBC/hpf   Bacteria, UA FEW (A) RARE   Urine-Other MUCOUS PRESENT    Treatments: None  Discharge Exam: Blood pressure 103/49, pulse 97, temperature 98.1 F (36.7 C), temperature source Oral, resp. rate 20, height 5\' 2"  (1.575 m), weight 260 lb (117.935 kg), SpO2 98 %. General appearance: alert, cooperative, no distress and moderately obese Resp: clear to auscultation bilaterally Cardio: regular rate and rhythm, S1, S2 normal, no murmur, click, rub or gallop GI: soft, non-tender; bowel sounds  normal; no masses,  no organomegaly and Fundus soft NT Extremities: Homans sign is negative, no sign of DVT Skin: Skin color, texture, turgor normal. No rashes or lesions  Disposition: 01-Home or Self Care  Discharge Instructions    Discharge activity:  No Restrictions    Complete by:  As directed      Discharge diet:    Complete by:  As directed   Low sodium, low fat     Discharge instructions    Complete by:  As directed   Per Doctors Orders            Medication List    STOP taking these medications        butalbital-acetaminophen-caffeine 50-325-40 MG per tablet  Commonly known as:  FIORICET      TAKE these medications        prenatal multivitamin Tabs tablet  Take 1 tablet by mouth daily at 12 noon.           Follow-up Information    [facility] On 02/03/2014.   Specialty:  Obstetrics and Gynecology   Why:  Please call if you have any questions or concerns prior to your next visit.   Contact information:   [address] [place] [id number] [phone]      Signed: Marlene BastMLY, Nura Cahoon LYNN, MSN, CNM 01/27/2014, 9:28 AM

## 2014-01-28 ENCOUNTER — Inpatient Hospital Stay (HOSPITAL_COMMUNITY)
Admission: AD | Admit: 2014-01-28 | Discharge: 2014-01-31 | DRG: 781 | Disposition: A | Discharge status: Home / Self Care | Payer: 59 | Source: Ambulatory Visit | Attending: Obstetrics & Gynecology | Admitting: Obstetrics & Gynecology

## 2014-01-28 ENCOUNTER — Inpatient Hospital Stay (HOSPITAL_COMMUNITY): Payer: 59

## 2014-01-28 ENCOUNTER — Encounter (HOSPITAL_COMMUNITY): Payer: Self-pay | Admitting: *Deleted

## 2014-01-28 DIAGNOSIS — O368190 Decreased fetal movements, unspecified trimester, not applicable or unspecified: Secondary | ICD-10-CM

## 2014-01-28 DIAGNOSIS — O99820 Streptococcus B carrier state complicating pregnancy: Secondary | ICD-10-CM | POA: Diagnosis present

## 2014-01-28 DIAGNOSIS — IMO0002 Reserved for concepts with insufficient information to code with codable children: Secondary | ICD-10-CM

## 2014-01-28 DIAGNOSIS — O321XX0 Maternal care for breech presentation, not applicable or unspecified: Secondary | ICD-10-CM | POA: Diagnosis present

## 2014-01-28 DIAGNOSIS — O368130 Decreased fetal movements, third trimester, not applicable or unspecified: Principal | ICD-10-CM | POA: Diagnosis present

## 2014-01-28 DIAGNOSIS — O368390 Maternal care for abnormalities of the fetal heart rate or rhythm, unspecified trimester, not applicable or unspecified: Secondary | ICD-10-CM

## 2014-01-28 DIAGNOSIS — Z6841 Body Mass Index (BMI) 40.0 and over, adult: Secondary | ICD-10-CM

## 2014-01-28 DIAGNOSIS — O2623 Pregnancy care for patient with recurrent pregnancy loss, third trimester: Secondary | ICD-10-CM | POA: Diagnosis present

## 2014-01-28 DIAGNOSIS — Z8249 Family history of ischemic heart disease and other diseases of the circulatory system: Secondary | ICD-10-CM

## 2014-01-28 DIAGNOSIS — O368131 Decreased fetal movements, third trimester, fetus 1: Secondary | ICD-10-CM

## 2014-01-28 DIAGNOSIS — Z833 Family history of diabetes mellitus: Secondary | ICD-10-CM

## 2014-01-28 DIAGNOSIS — O3421 Maternal care for scar from previous cesarean delivery: Secondary | ICD-10-CM | POA: Diagnosis present

## 2014-01-28 DIAGNOSIS — Z3A34 34 weeks gestation of pregnancy: Secondary | ICD-10-CM | POA: Diagnosis present

## 2014-01-28 DIAGNOSIS — O99213 Obesity complicating pregnancy, third trimester: Secondary | ICD-10-CM | POA: Diagnosis present

## 2014-01-28 MED ORDER — CALCIUM CARBONATE ANTACID 500 MG PO CHEW: 2.0000 | CHEWABLE_TABLET | ORAL | Status: DC | PRN

## 2014-01-28 MED ORDER — ZOLPIDEM TARTRATE 5 MG PO TABS
5.0000 mg | ORAL_TABLET | Freq: Every evening | ORAL | Status: DC | PRN
  Administered 2014-01-29 – 2014-01-30 (×3): 5 mg via ORAL
  Filled 2014-01-28 (×3): qty 1

## 2014-01-28 MED ORDER — LACTATED RINGERS IV BOLUS (SEPSIS)
1000.0000 mL | Freq: Once | INTRAVENOUS | Status: AC
  Administered 2014-01-28: 1000 mL via INTRAVENOUS

## 2014-01-28 MED ORDER — PRENATAL MULTIVITAMIN CH: 1.0000 | ORAL_TABLET | Freq: Every day | ORAL | Status: DC

## 2014-01-28 MED ORDER — DOCUSATE SODIUM 100 MG PO CAPS
100.0000 mg | ORAL_CAPSULE | Freq: Every day | ORAL | Status: DC
Start: 2014-01-28 — End: 2014-01-31
  Administered 2014-01-29 – 2014-01-31 (×2): 100 mg via ORAL
  Filled 2014-01-28 (×3): qty 1

## 2014-01-28 MED ORDER — ACETAMINOPHEN 325 MG PO TABS
650.0000 mg | ORAL_TABLET | ORAL | Status: DC | PRN
  Administered 2014-01-30: 650 mg via ORAL
  Filled 2014-01-28: qty 2

## 2014-01-28 MED ORDER — PRENATAL MULTIVITAMIN CH
1.0000 | ORAL_TABLET | Freq: Every day | ORAL | Status: DC
  Administered 2014-01-29 – 2014-01-31 (×2): 1 via ORAL
  Filled 2014-01-28 (×3): qty 1

## 2014-01-28 MED ORDER — NIFEDIPINE 10 MG PO CAPS
10.0000 mg | ORAL_CAPSULE | Freq: Once | ORAL | Status: AC
  Administered 2014-01-28: 10 mg via ORAL
  Filled 2014-01-28: qty 1

## 2014-01-28 NOTE — Progress Notes (Signed)
Hospital day # 0 pregnancy at 6086w3d--prolong decel, x 6 minutes 1952.  Recent Hx of fetal tachy and ctxs  S:  Perception of contractions: none, regular, every 4-6 minutes      Vaginal bleeding: none now       Vaginal discharge:  no significant change  O: BP 116/39 mmHg  Pulse 94  Temp(Src) 98.2 F (36.8 C) (Oral)  Resp 20  Ht 5\' 2"  (1.575 m)  Wt 258 lb (117.028 kg)  BMI 47.18 kg/m2  SpO2 100%      Fetal tracings:      Contractions:   q4-6      Uterus gravid and non-tender      Extremities: edema +1 and Homans sign is negative, no sign of DVT          Labs:  No results found for this or any previous visit (from the past 24 hour(s)).       Meds: Current facility-administered medications: acetaminophen (TYLENOL) tablet 650 mg, 650 mg, Oral, Q4H PRN, Jantz Main, CNM;  calcium carbonate (TUMS - dosed in mg elemental calcium) chewable tablet 400 mg of elemental calcium, 2 tablet, Oral, Q4H PRN, Maurico Perrell, CNM;  docusate sodium (COLACE) capsule 100 mg, 100 mg, Oral, Daily, Saree Krogh, CNM [START ON 01/29/2014] prenatal multivitamin tablet 1 tablet, 1 tablet, Oral, Q1200, Sienna Stonehocker, CNM;  zolpidem (AMBIEN) tablet 5 mg, 5 mg, Oral, QHS PRN, Cervando Durnin, CNM  A: 4686w3d with SP prolong decel and ctx     Fetal tracings: Category 2--baseline 135, occasional variables, accels noted      Contractions: q4-6     Uterus non-tender      Extremities: DTR 1+, no clonus, 1edema   P: Continue current plan of care      Consult with Dr.Kulwa for plan on care      Procardia x1      MDs will follow  Nanea Jared, CNM, MSN 01/28/2014. 11:09 PM

## 2014-01-28 NOTE — MAU Note (Signed)
C/o decreased fetal movement for past 3-4 days; hospitalized overnight for DFM;

## 2014-01-28 NOTE — MAU Provider Note (Signed)
MAU Addendum Note IV  Bolus complete, pt reports feeling the same. VE closed/thick/hgh  Pt about to go to US for AFI and BPP.  FHR 135 + accel, no decel, category 1 strip up until 1952 then she had a prolong decel x 6 minutes.  Fetal resuscitative measure done, fht return to stable baseline prior to transfer to US at 2018  Plan: Admit to [facility] for prolong Obs Consulted with Dr. Burna FortsKulwa   Mary Whitehead, CNM, MSN 01/28/2014. 8:12 PM

## 2014-01-28 NOTE — H&P (Signed)
23 yo G5P1031 at 34 3/7 weeks present to MAU c/o decreased FM x 3 hours and occasional ctx since yesterday. She denies vb or lof. She report note hydrating much today. She also c/o groin pain more in the right than the left. She was admitted and discharged on 01/27/14 for Fetal tachycardia and fetal heart rate non-reactive & BPP 6/8 (-2 for breathing).  Pregnancy complicated by previous recurrent miscarriages. Pt is morbidly obese and GBS +. Pt about to go to US for AFI and BPP. FHR 135 + accel, no decel, category 1 strip up until 1952 then she had a prolong decel x 6 minutes. Fetal resuscitative measure done, fht return to stable baseline prior to transfer to US at 2018   Patient Active Problem List   Diagnosis Date Noted  . Fetal tachycardia 01/26/2014  . Fetal heart rate nonreactive 01/26/2014  . Morbid obesity 01/26/2014  . Positive GBS test 01/26/2014  . Pregnancy complicated by previous recurrent miscarriages 01/26/2014  . Adjustment disorder with mixed anxiety and depressed mood - dx'd at age 23, no meds 08/25/2013  . Migraine, unspecified, without mention of intractable migraine without mention of status migrainosus 08/25/2013  . 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 H/O: C-section in 2011 for FTP beyond 9 cm; desires VBAC 08/25/2013  . Cramping affecting pregnancy, antepartum 08/25/2013    History of present pregnancy: Patient entered care at 10 1/7 weeks.  EDC of 03/08/14 was established by US at 9 3/7 weeks.  Anatomy scan: 19 2/7 weeks, with normal findings and an low lying placenta.  Additional US evaluations:  15 3/7 weeks for pain: Normal findings, cervix WNL. 27 2/7 weeks, f/u on LL placenta: LLP resolved, breech, EFW 1241 gm..  Significant prenatal events: Cramping with urination at 15 weeks, UA negative, no other significant findings. Sciatica noted at 23 weeks. Had  LL placenta, resolved by 27 weeks. Increased depression at 30 2/7 weeks. [facility], but patient declined. Discussed hope for VBAC. Depression sx improved by 31 weeks. GBS noted on vaginal culture 11/24/13. Elevated 1 hour GTT, but normal 3 hour GTT. Had normal 1st trimester screen, declined AFP. Last evaluation: Today, with NST for decreased FM.  Chief Complaint  Patient presents with  . Contractions  . Non-stress Test   HPI: See above  OB History    Gravida Para Term Preterm AB TAB SAB Ectopic Multiple Living   5 1 1  3  3   1     2011--Primary LTCS due to FTP, 9 cm, 40 5/7 weeks, 20 hour labor, 8+13, female, Dr. Arelia SneddonMcComb 2012--SAB at 6 weeks 2013--SAB at 8 weeks 12/2012--SAB at 6 weeks.  Past Medical History  Diagnosis Date  . Headache(784.0) 08/02/2012    Migraines  . Anxiety   . Depression     Past Surgical History  Procedure Laterality Date  . Cesarean section    . Wisdom tooth extraction      Family History  Problem Relation Age of Onset  . Diabetes Father   . Hypertension Father   . Cancer Father   . Cancer Maternal Grandmother   . Diabetes Paternal Grandmother   . Hypertension Paternal Grandmother     History  Substance Use Topics  . Smoking status: Never Smoker   . Smokeless tobacco: Never Used  . Alcohol Use: Yes     Comment: occ  Patient is [place], single, with some college, employed  as claims rep with [employer].  Allergies: No Known Allergies  Prescriptions prior to admission  Medication Sig Dispense Refill Last Dose  . Prenatal Vit-Fe Fumarate-FA (PRENATAL MULTIVITAMIN) TABS tablet Take 1 tablet by mouth daily at 12 noon.   01/25/2014 at Unknown time  . butalbital-acetaminophen-caffeine (FIORICET) 50-325-40 MG per tablet Take 1-2 tablets by mouth every 6 (six) hours  as needed for headache. (Patient not taking: Reported on 01/26/2014) 36 tablet 2     ROS: Occasional contractions, +FM Physical Exam   Blood pressure 103/49, pulse 97, temperature 98.1 F (36.7 C), temperature source Oral, resp. rate 20, height 5\' 2"  (1.575 m), weight 260 lb (117.935 kg), SpO2 98 %.  Physical Exam  Chest clear Heart RRR without murmur Abd gravid, NT Pelvic--closed, long, PP OOP, vtx to US. Ext WNL  FHR reassuring, but not classically reactive UCs occasional, mild  ED Course  Assessment: IUP at 34 1/7 weeks  Likely broad FHR accels on NST at office  Plan: BPP/AFI pending Will admit for observation Continuous EFM. Reassess FHR through night to determine if repeat BPP would be appropriate tomorrow Dr Sallye OberKulwa aware

## 2014-01-28 NOTE — Progress Notes (Signed)
Notified of FHR deceleration. Will give more IV fluids

## 2014-01-28 NOTE — MAU Note (Signed)
Urine in lab 

## 2014-01-28 NOTE — MAU Provider Note (Signed)
Mary Whitehead is a 23 y.o. G5P1031 at 34.3 weeks present to MAU c/o decreased FM x 3 hours and occasional ctx since yesterday.  She denies vb or lof.  She report note hydrating much today.  She also c/o groin pain more in the right than the left. She was admitted and discharged on 01/27/14 for Fetal tachycardia and fetal heart rate non-reactive & BPP 6/8 (-2 for breathing).  Pregnancy complicated by previous recurrent miscarriages.  Pt is morbidly obese and GBS +.       History     Patient Active Problem List   Diagnosis Date Noted  . [redacted] weeks gestation of pregnancy   . Fetal tachycardia affecting management of mother   . Threatened preterm labor   . Fetal tachycardia 01/26/2014  . Fetal heart rate nonreactive 01/26/2014  . Morbid obesity 01/26/2014  . Positive GBS test 01/26/2014  . Pregnancy complicated by previous recurrent miscarriages 01/26/2014  . Adjustment disorder with mixed anxiety and depressed mood - dx'd at age 23, no meds 08/25/2013  . Migraine, unspecified, without mention of intractable migraine without mention of status migrainosus 08/25/2013  . 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 H/O: C-section in 2011 for FTP beyond 9 cm; desires VBAC 08/25/2013  . Cramping affecting pregnancy, antepartum 08/25/2013    No chief complaint on file.  HPI  OB History    Gravida Para Term Preterm AB TAB SAB Ectopic Multiple Living   5 1 1  3  3   1       Past Medical History  Diagnosis Date  . Headache(784.0) 08/02/2012    Migraines  . Anxiety   . Depression     Past Surgical History  Procedure Laterality Date  . Cesarean section    . Wisdom tooth extraction      Family History  Problem Relation Age of Onset  . Diabetes Father   . Hypertension Father   . Cancer Father   . Cancer Maternal Grandmother   . Diabetes Paternal Grandmother   . Hypertension Paternal Grandmother     History  Substance Use Topics  .  Smoking status: Never Smoker   . Smokeless tobacco: Never Used  . Alcohol Use: Yes     Comment: occ    Allergies: No Known Allergies  Prescriptions prior to admission  Medication Sig Dispense Refill Last Dose  . Prenatal Vit-Fe Fumarate-FA (PRENATAL MULTIVITAMIN) TABS tablet Take 1 tablet by mouth daily at 12 noon.   01/25/2014 at Unknown time    ROS See HPI above, all other systems are negative  Physical Exam   There were no vitals taken for this visit.  Physical Exam Ext:  WNL ABD: Soft, non tender to palpation, no rebound or guarding SVE: deferred   ED Course  Assessment: IUP at  34.3weeks Membranes: intact FHR: Category 1 CTX:  occasional   Plan: AFI & BPP Observation IV bolus    Jaydn Fincher, CNM, MSN 01/28/2014. 5:55 PM

## 2014-01-29 ENCOUNTER — Inpatient Hospital Stay (HOSPITAL_COMMUNITY): Payer: 59

## 2014-01-29 LAB — TYPE AND SCREEN
ABO/RH(D): O POS
Antibody Screen: NEGATIVE

## 2014-01-29 LAB — ABO/RH: ABO/RH(D): O POS

## 2014-01-29 MED ORDER — CYCLOBENZAPRINE HCL 10 MG PO TABS
10.0000 mg | ORAL_TABLET | Freq: Three times a day (TID) | ORAL | Status: DC | PRN
  Administered 2014-01-29: 10 mg via ORAL
  Filled 2014-01-29: qty 1

## 2014-01-29 NOTE — Consult Note (Signed)
MFM consult, staff Note  Impression: SIUP at 86w4dEFW 77th% Limited anatomic evaluation without demonstrated defect BPP 8/8 Placental implantation on posterior uterine wall,  no previa or sonographic evidence of abruption Decreased fetal movement x about 1 week Tracing demonstrates a prolonged deceleration x 635m 01/28/14 with occasional variable since  Recommendations: 1. I am concerned about occult abruption and recommend continued inpatient observation until no decels x 24 hours and patient sense of fetal movement meets criteria for active fetus (>10 kicks in <2 hours). 2. After above criteria are met, then strict kick counts at minimum q8 hours along with twice weekly antenatal testing and weekly AFI/monthly interval growth 3. If prolonged decelerations or nonreassuring tracing is seen, I would strongly consider delivery.  Time Spent: I spent in excess of 30 minutes in consultation with this patient to review records, evaluate her case, and provide her with an adequate discussion and education.  More than 50% of this time was spent in direct face-to-face counseling. It was a pleasure seeing your patient in the office today.  Thank you for consultation. Please do not hesitate to contact our service for any further questions.    Thank you,  JeDelman CheadleeHarl FavorJeDelman CheadleMD, MS, FACOG Assistant Professor Section of [facility]

## 2014-01-29 NOTE — Progress Notes (Signed)
Hospital day # 1 pregnancy at 2812w4d   S:  Perception of contractions: none, regular, every 15 minutes      Vaginal bleeding: none now       Vaginal discharge:  no significant change  O: BP 116/39 mmHg  Pulse 89  Temp(Src) 98.2 F (36.8 C) (Oral)  Resp 20  Ht 5\' 2"  (1.575 m)  Wt 258 lb (117.028 kg)  BMI 47.18 kg/m2  SpO2 100%      Fetal tracings: 135. Moderate variability, + accel, no decels      Contractions:   occasional      Uterus gravid and non-tender      Extremities: no edema, redness or tenderness in the calves or thighs and no significant edema and no signs of DVT          Labs:  No results found for this or any previous visit (from the past 24 hour(s)).       Meds: Current facility-administered medications: acetaminophen (TYLENOL) tablet 650 mg, 650 mg, Oral, Q4H PRN, Lenzie Sandler, CNM;  calcium carbonate (TUMS - dosed in mg elemental calcium) chewable tablet 400 mg of elemental calcium, 2 tablet, Oral, Q4H PRN, Nawal Burling, CNM;  docusate sodium (COLACE) capsule 100 mg, 100 mg, Oral, Daily, Almyra Birman, CNM, 100 mg at 01/28/14 2309 prenatal multivitamin tablet 1 tablet, 1 tablet, Oral, Q1200, Avyukth Bontempo, CNM;  zolpidem (AMBIEN) tablet 5 mg, 5 mg, Oral, QHS PRN, Pavlos Yon, CNM, 5 mg at 01/29/14 0107  A: 1512w4d with      stable     Fetal tracings: Category 1--baseline 135, moderate variables, +accels       Contractions: Occasional     Uterus non-tender      Extremities: DTR 1+, no clonus, +1 edema   P: Continue current plan of care      Possible discharge later today      MDs will follow  Johnelle Tafolla, CNM, MSN 01/29/2014. 3:33 AM

## 2014-01-29 NOTE — Progress Notes (Signed)
  Hospital day # 1 pregnancy at 2226w4d--Decreased FM, sporadic FHR decels, previous C/S.  S:  Still feels less FM than she has previous to hospitalization.       Has significant pain in right groin with movement and walking      Perception of contractions: None      Vaginal bleeding: None       Vaginal discharge:  None  Has been NPO since admission.  O: BP 106/50 mmHg  Pulse 85  Temp(Src) 98 F (36.7 C) (Oral)  Resp 20  Ht 5\' 2"  (1.575 m)  Wt 258 lb (117.028 kg)  BMI 47.18 kg/m2  SpO2 100%      Fetal tracings:  Overall Category 1, with prolonged decel last night around 1952, then variable decel at 0918.  FHR reactive before recent variable, reassuring at present after      Contractions:   Occasional, mild      Uterus non-tender      Extremities: no significant edema and no signs of DVT       Pain in right groin and with movement of that leg--likely round ligament pain          BPP last night at 2108:  8/8, AFI 12.55, 35%ile, vtx.       Meds: None  A: 2026w4d with sporadic decels      Decreased perception of FM     Previous C/S     Round ligament pain on right   P: Consulted with Dr. Normand Sloopillard      Will allow regular diet      Upcoming tests/treatments: MFM consult due to 34 4/7 weeks, FHR       decels, interspersed with Category 1 FHR, with US for growth and       Dopplers.      Flexeril for RLP.      MDs will follow  Nigel BridgemanLATHAM, Merari Pion CNM, [place] 01/29/2014 9:52 AM

## 2014-01-29 NOTE — Progress Notes (Signed)
To maternal fetal medicine for [place]

## 2014-01-29 NOTE — Progress Notes (Addendum)
Returned from MFM:  Seen by Dr. Sharlet Salina, with following notes:  Impression: SIUP at 70w4dEFW 77th% Limited anatomic evaluation without demonstrated defect BPP 8/8 Placental implantation on posterior uterine wall,  no previa or sonographic evidence of abruption Decreased fetal movement x about 1 week Tracing demonstrates a prolonged deceleration x 615m 01/28/14 with occasional variable since  Recommendations: 1. I am concerned about occult abruption and recommend continued inpatient observation until no decels x 24 hours and patient sense of fetal movement meets criteria for active fetus (>10 kicks in <2 hours). 2. After above criteria are met, then strict kick counts at minimum q8 hours along with twice weekly antenatal testing and weekly AFI/monthly interval growth 3. If prolonged decelerations or nonreassuring tracing is seen, I would strongly consider delivery.  [place] EFW 5+15, 77%ile, BREECH, AFI 10.27, 22%ile.   I reviewed Dr. DeChilton Greathouseotes with Dr. DiCharlesetta Garibaldind the patient. Will continue to observe at present, awaiting 24 hours without decels, and for increased perception of FM by patient. She understands the plan of care and is in agreement.  ViDonnel SaxonCNM 01/29/14 6:40p

## 2014-01-30 ENCOUNTER — Encounter (HOSPITAL_COMMUNITY): Payer: Self-pay | Admitting: *Deleted

## 2014-01-30 MED ORDER — BUTALBITAL-APAP-CAFFEINE 50-325-40 MG PO TABS
2.0000 | ORAL_TABLET | Freq: Four times a day (QID) | ORAL | Status: DC | PRN
  Administered 2014-01-30: 2 via ORAL

## 2014-01-30 MED ORDER — BUTALBITAL-APAP-CAFFEINE 50-325-40 MG PO TABS
ORAL_TABLET | ORAL | Status: AC
  Filled 2014-01-30: qty 1

## 2014-01-30 NOTE — Progress Notes (Addendum)
Hospital day # 2 pregnancy at 6120w5d--sporadic decels, decreased perception of FM, previous C/S, breech presentation  S:  Sleeping soundly at present.  Had reported Flexeril not helping right groin/round ligament pain.      Perception of contractions: Irregular, every 8-10 minutes.  Had described      UCs as 7/10 when awake, but has been sleeping soundly through      contractions during night.      Vaginal bleeding: None       Vaginal discharge:  None  O: BP 114/64 mmHg  Pulse 97  Temp(Src) 98.1 F (36.7 C) (Oral)  Resp 18  Ht 5\' 2"  (1.575 m)  Wt 258 lb (117.028 kg)  BMI 47.18 kg/m2  SpO2 100%      Fetal tracings:  Reactive overall, but 2 min decel at 0104 and mild variable at 0548.  Tracing Category 1 prior to and subsequent to decels      Contractions:   Irregular, q 8-10 min      Uterus non-tender      Extremities: no significant edema and no signs of DVT                 Meds:  . docusate sodium  100 mg Oral Daily  . prenatal multivitamin  1 tablet Oral Q1200     A:  5220w5d with sporadic decels, breech, previous C/S, decreased  perception of FM   Round ligament pain       P: Continue current plan of care      Upcoming tests/treatments:  Continuous monitoring      Monitor FKC      Cervical evaluation if UCs warrant      MDs will follow  Nigel BridgemanLATHAM, VICKI CNM, [place] 01/30/2014 7:38 AM    I saw patient at bedside and agre with above findings, assessment and plan.  Patient states she has felt fetal movements earlier in the morning, but none recently.  She has not had breakfast yet.  I advised her to eat breakfast then do kick counts, if less than 4 in an hour or 10 in 2 hrs to notify nurse.  NST : 120 bl, mod var, Reactive.  TOCO: Irregular contractions. Last deceleration at 0104 am: Spontaneous prolonged decel X 3.5 minutes from 120 to 80. Continue with monitoring, as per MFM consult ensure at least 24 hrs without decelerations.  Patient c/o of migraine headache, with hx of  migraines.  With normal BPs. Tylenol not working, Geneticist, molecularGive Fioricet. Cervix checked at bedside, closed/long. EK.

## 2014-01-30 NOTE — Plan of Care (Signed)
Problem: Phase I Progression Outcomes Goal: Pain controlled with appropriate interventions Outcome: Progressing Establishing alternative pain management methods with provider. Patient resting when walked into room to perform morning assessment.

## 2014-01-30 NOTE — Plan of Care (Signed)
Problem: Consults Goal: Antepartum Patient Education Outcome: Progressing Goal: Birthing Suites Patient Information Press F2 to bring up selections list   Pt < [redacted] weeks EGA Goal: Skin Care Protocol Initiated - if Braden Score 18 or less If consults are not indicated, leave blank or document N/A  Outcome: Not Applicable Date Met:  94/09/05 Braden scale 22. Goal: Diabetes Guidelines per MD order/protocol Outcome: Not Applicable Date Met:  02/56/15 Goal: Orientation to unit: [place] (smoking, visitation, chaplain services, helpline)  Outcome: Progressing  Problem: Phase I Progression Outcomes Goal: Diabetic Assessment Outcome: Not Applicable Date Met:  48/84/57 Goal: No significant worsening bleeding/cervix change/vag drainage No significant worsening in vaginal bleeding, cervical change, or vaginal drainage.  Outcome: Progressing  Problem: Phase II Progression Outcomes Goal: Electronic fetal monitoring(Doppler,Continuous,Intermittent) EFM (Doppler, Continuous, Intermittent)  Outcome: Progressing Continuous FHR and TOCO. Goal: Tolerating diet Outcome: Progressing Goal: Mattress in use Mattress in use ( Eggcrate, Med/Surg, Regular, Birthing Suite, Other)  Outcome: Progressing Reg. Goal: Labs/tests as ordered Labs/tests as ordered (Magnesium level, CBG's, CBC, CMET, 24 hr Urine, Amniocentesis, Ultrasound, Other)  Outcome: Progressing Goal: Medications as ordered (see description) Medications as ordered (Magnesium Sulfate, Steroids, PO Tocolysis, Antibiotics, Terbutaline Pump, Other)  Outcome: Progressing

## 2014-01-30 NOTE — Progress Notes (Signed)
Strip reviewed with Dr. Ozan. Pt w/ onCharlotta Newtone prolonged decel down to the 80s at  ~ 01:04 AM x 2 min. BL returned to normal with position change. 2nd decel (variable) noted at 0548 x 20-25 secs with return to baseline, also w/ position change. +irregular, mild ctxs.  See MFM recs regarding plan should pt continue to have recurrent late decels.   Sherre ScarletKimberly Vern Prestia, CNM 01/30/14, 06:44 AM

## 2014-01-30 NOTE — Progress Notes (Signed)
Dr Sallye OberKulwa notified of patient's complaint of HA, rating 8/10, pain response to Tylenol, patient appears resting and calm. Orders for Fiorcet.

## 2014-01-31 MED ORDER — BUTALBITAL-APAP-CAFFEINE 50-325-40 MG PO TABS: 2.0000 | ORAL_TABLET | Freq: Four times a day (QID) | ORAL | Status: DC | PRN

## 2014-01-31 NOTE — Progress Notes (Addendum)
C-section consent obtained. All questions answered to pt's satisfaction.   Mary Whitehead, CNM 01/31/14, 09:21 AM

## 2014-01-31 NOTE — Progress Notes (Signed)
Hospital day # 3 pregnancy at 4835w6d--Decreased FM, Sporadic FHR Decelerations, Previous C/S.  S:  Asleep, but easily awakened.  Denies pain and reports fetal movement.  Understands how to perform kick counts.      Perception of contractions: None      Vaginal bleeding: None       Vaginal discharge: None  O: BP 102/60 mmHg  Pulse 98  Temp(Src) 98.4 F (36.9 C) (Oral)  Resp 18  Ht 5\' 2"  (1.575 m)  Wt 258 lb (117.028 kg)  BMI 47.18 kg/m2  SpO2 100%      Fetal tracings: 125 bpm, Mod Var, -Decels, +Accels      Contractions:  Occasional mild contraction      Uterus gravid and non-tender      Extremities: no significant edema and no signs of DVT          Labs:  No results found for this or any previous visit (from the past 24 hour(s)).        Meds:  No current facility-administered medications on file prior to encounter.   Current Outpatient Prescriptions on File Prior to Encounter  Medication Sig Dispense Refill  . Prenatal Vit-Fe Fumarate-FA (PRENATAL MULTIVITAMIN) TABS tablet Take 1 tablet by mouth daily at 12 noon.     A: 1035w6d with Sporadic Decelerations Decreased Perception of Fetal Movement  Previous C/S Stable  P: Dr. Carmela HurtE. Kulwa advised plan as below: Discuss performing kick counts, with patient to peform after breakfast, and reporting back to provider Plan for discharge after lunch Follow up in office as scheduled Patient declines need for consults including SW and Chaplain  Will follow up prior to lunch Continue other mgmt as ordered  [facility] Hospital[facility], Patton Rabinovich LYNN CNM, MSN 01/31/2014 9:02 AM

## 2014-01-31 NOTE — Discharge Instructions (Signed)
Third Trimester of Pregnancy The third trimester is from week 29 through week 42, months 7 through 9. This trimester is when your unborn baby (fetus) is growing very fast. At the end of the ninth month, the unborn baby is about 20 inches in length. It weighs about 6-10 pounds.  HOME CARE   Avoid all smoking, herbs, and alcohol. Avoid drugs not approved by your doctor.  Only take medicine as told by your doctor. Some medicines are safe and some are not during pregnancy.  Exercise only as told by your doctor. Stop exercising if you start having cramps.  Eat regular, healthy meals.  Wear a good support bra if your breasts are tender.  Do not use hot tubs, steam rooms, or saunas.  Wear your seat belt when driving.  Avoid raw meat, uncooked cheese, and liter boxes and soil used by cats.  Take your prenatal vitamins.  Try taking medicine that helps you poop (stool softener) as needed, and if your doctor approves. Eat more fiber by eating fresh fruit, vegetables, and whole grains. Drink enough fluids to keep your pee (urine) clear or pale yellow.  Take warm water baths (sitz baths) to soothe pain or discomfort caused by hemorrhoids. Use hemorrhoid cream if your doctor approves.  If you have puffy, bulging veins (varicose veins), wear support hose. Raise (elevate) your feet for 15 minutes, 3-4 times a day. Limit salt in your diet.  Avoid heavy lifting, wear low heels, and sit up straight.  Rest with your legs raised if you have leg cramps or low back pain.  Visit your dentist if you have not gone during your pregnancy. Use a soft toothbrush to brush your teeth. Be gentle when you floss.  You can have sex (intercourse) unless your doctor tells you not to.  Do not travel far distances unless you must. Only do so with your doctor's approval.  Take prenatal classes.  Practice driving to the hospital.  Pack your hospital bag.  Prepare the baby's room.  Go to your doctor visits. GET  HELP IF:  You are not sure if you are in labor or if your water has broken.  You are dizzy.  You have mild cramps or pressure in your lower belly (abdominal).  You have a nagging pain in your belly area.  You continue to feel sick to your stomach (nauseous), throw up (vomit), or have watery poop (diarrhea).  You have bad smelling fluid coming from your vagina.  You have pain with peeing (urination). GET HELP RIGHT AWAY IF:   You have a fever.  You are leaking fluid from your vagina.  You are spotting or bleeding from your vagina.  You have severe belly cramping or pain.  You lose or gain weight rapidly.  You have trouble catching your breath and have chest pain.  You notice sudden or extreme puffiness (swelling) of your face, hands, ankles, feet, or legs.  You have not felt the baby move in over an hour.  You have severe headaches that do not go away with medicine.  You have vision changes. Document Released: 05/10/2009 Document Revised: 06/10/2012 Document Reviewed: 04/16/2012 ExitCare Patient Information 2015 [employer]. This information is not intended to replace advice given to you by your health care provider. Make sure you discuss any questions you have with your health care provider.  

## 2014-01-31 NOTE — Progress Notes (Signed)
Patient discharged home in ambulatory condition with family at side. Patient denies any c/o at the current time. Discharge teaching completed with patient verbalizing an understanding and providing a teach back of the information. Patient to follow up in the office on Tuesday with provider and for an NST.

## 2014-01-31 NOTE — Discharge Summary (Signed)
Physician Discharge Summary  Patient ID: Mary Whitehead MRN: [id number] DOB/AGE: 07/11/1990 23 y.o.  Admit date: 01/28/2014 Discharge date: 01/31/2014  Admission Diagnoses: Prolonged FM for Maternal Perception of Decreased FM and Sporadic Deceleration  Discharge Diagnoses:  Active Problems:   Fetal heart deceleration   Fetal heart rate decelerations affecting management of mother   Decreased fetal movement   Discharged Condition: Stable, FM detected  Hospital Course: BPP, Extended FM, Fiorcet, Flexeril, Procardia  Consults: MFM  Significant Diagnostic Studies: BPP: 8/8, AFI 12.55, 35%ile, vtx. No results found for this or any previous visit (from the past 24 hour(s)).   Treatments: None  Discharge Exam: Blood pressure 109/57, pulse 82, temperature 98.5 F (36.9 C), temperature source Oral, resp. rate 20, height 5\' 2"  (1.575 m), weight 258 lb (117.028 kg), SpO2 100 %. General appearance: alert, cooperative, no distress and moderately obese Resp: clear to auscultation bilaterally Cardio: regular rate and rhythm, S1, S2 normal, no murmur, click, rub or gallop GI: soft, non-tender; bowel sounds normal; no masses,  no organomegaly and Fundus soft NT Extremities: Homans sign is negative, no sign of DVT Skin: Skin color, texture, turgor normal. No rashes or lesions  Disposition: 01-Home or Self Care     Medication List    TAKE these medications        butalbital-acetaminophen-caffeine 50-325-40 MG per tablet  Commonly known as:  FIORICET, ESGIC  Take 2 tablets by mouth every 6 (six) hours as needed for headache or migraine (May use 1 tablet for pain scale less than 6, 2 tablets for pain scale 6 or greater.).     prenatal multivitamin Tabs tablet  Take 1 tablet by mouth daily at 12 noon.        Encouraged to call if any questions or concerns arise prior to next scheduled office visit.   Signed: Marlene BastEMLY, Jordie Schreur LYNN, MSN, CNM 01/31/2014, 12:59 PM

## 2014-02-16 ENCOUNTER — Inpatient Hospital Stay (HOSPITAL_COMMUNITY)
Admission: AD | Admit: 2014-02-16 | Discharge: 2014-02-16 | Disposition: A | Discharge status: Home / Self Care | Payer: 59 | Source: Ambulatory Visit | Attending: Obstetrics and Gynecology | Admitting: Obstetrics and Gynecology

## 2014-02-16 DIAGNOSIS — O368130 Decreased fetal movements, third trimester, not applicable or unspecified: Secondary | ICD-10-CM | POA: Insufficient documentation

## 2014-02-16 DIAGNOSIS — Z3A37 37 weeks gestation of pregnancy: Secondary | ICD-10-CM | POA: Insufficient documentation

## 2014-02-16 DIAGNOSIS — O368131 Decreased fetal movements, third trimester, fetus 1: Secondary | ICD-10-CM

## 2014-02-16 NOTE — Discharge Instructions (Signed)

## 2014-02-16 NOTE — MAU Provider Note (Signed)
Mary Whitehead is a 23 y.o. G5P1031 at 37.1 weeks present to MAU c/o decrease fetal movement since this afternoon.  She report doing the kick count challenge of eating and hydrating but still decrease FM.  She denies vb or lof.  She report failing her NST on Friday.   History     Patient Active Problem List   Diagnosis Date Noted  . Fetal heart rate decelerations affecting management of mother 01/29/2014  . Decreased fetal movement   . Fetal heart deceleration 01/28/2014  . [redacted] weeks gestation of pregnancy   . Fetal tachycardia affecting management of mother   . Threatened preterm labor   . Fetal tachycardia 01/26/2014  . Fetal heart rate nonreactive 01/26/2014  . Morbid obesity 01/26/2014  . Positive GBS test 01/26/2014  . Pregnancy complicated by previous recurrent miscarriages 01/26/2014  . Adjustment disorder with mixed anxiety and depressed mood - dx'd at age 89, no meds 08/25/2013  . Migraine, unspecified, without mention of intractable migraine without mention of status migrainosus 08/25/2013  . 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 H/O: C-section in 2011 for FTP beyond 9 cm; desires VBAC 08/25/2013  . Cramping affecting pregnancy, antepartum 08/25/2013    Chief Complaint  Patient presents with  . Decreased Fetal Movement   HPI  OB History    Gravida Para Term Preterm AB TAB SAB Ectopic Multiple Living   5 1 1  3  3   1       Past Medical History  Diagnosis Date  . Headache(784.0) 08/02/2012    Migraines  . Anxiety   . Depression     Past Surgical History  Procedure Laterality Date  . Cesarean section    . Wisdom tooth extraction      Family History  Problem Relation Age of Onset  . Diabetes Father   . Hypertension Father   . Cancer Father   . Cancer Maternal Grandmother   . Diabetes Paternal Grandmother   . Hypertension Paternal Grandmother     History  Substance Use Topics  . Smoking status:  Never Smoker   . Smokeless tobacco: Never Used  . Alcohol Use: Yes     Comment: occ    Allergies: No Known Allergies  Prescriptions prior to admission  Medication Sig Dispense Refill Last Dose  . butalbital-acetaminophen-caffeine (FIORICET, ESGIC) 50-325-40 MG per tablet Take 2 tablets by mouth every 6 (six) hours as needed for headache or migraine (May use 1 tablet for pain scale less than 6, 2 tablets for pain scale 6 or greater.). 14 tablet 0   . Prenatal Vit-Fe Fumarate-FA (PRENATAL MULTIVITAMIN) TABS tablet Take 1 tablet by mouth daily at 12 noon.   01/27/2014 at Unknown time    ROS See HPI above, all other systems are negative  Physical Exam   Blood pressure 125/72, pulse 120, temperature 99.4 F (37.4 C), temperature source Oral, resp. rate 18, height 5\' 2"  (1.575 m), weight 259 lb 6.4 oz (117.663 kg), SpO2 99 %.  Physical Exam Ext:  WNL ABD: Soft, non tender to palpation, no rebound or guarding SVE: deferred   ED Course  Assessment: IUP at  37.1weeks Membranes: intact FHR: Category 1 CTX:  none   Plan: NST   Richard Holz, CNM, MSN 02/16/2014. 8:38 PM

## 2014-02-16 NOTE — MAU Note (Signed)
Fetal monitoring done in triage with hard copy print out of fetal tracing.

## 2014-02-16 NOTE — MAU Note (Signed)
Decreased fetal movement today since 4 pm; can't tell me last time felt baby move. Denies painful or regular contractions. Denies vaginal bleeding or LOF.

## 2014-02-16 NOTE — MAU Provider Note (Signed)
MAU Addendum Note  NST reactive FHR150 + accel, moderate variability, no decel, occasional ctx.     Plan: -Discussed need to follow up in office tomorrow for schudle OB visit -Bleeding and labor Precautions -Encouraged to call if any questions or concerns arise prior to next scheduled office visit.  -Discharged to home in stable condition Consulted with Dr. Royal HawthornStringer   Mary Whitehead, CNM, MSN 02/16/2014. 8:55 PM

## 2014-03-01 ENCOUNTER — Encounter (HOSPITAL_COMMUNITY): Payer: Self-pay | Admitting: *Deleted

## 2014-03-01 ENCOUNTER — Inpatient Hospital Stay (HOSPITAL_COMMUNITY)
Admission: AD | Admit: 2014-03-01 | Discharge: 2014-03-01 | Disposition: A | Discharge status: Home / Self Care | Payer: 59 | Source: Ambulatory Visit | Attending: Obstetrics & Gynecology | Admitting: Obstetrics & Gynecology

## 2014-03-01 DIAGNOSIS — N898 Other specified noninflammatory disorders of vagina: Secondary | ICD-10-CM | POA: Diagnosis present

## 2014-03-01 DIAGNOSIS — O99213 Obesity complicating pregnancy, third trimester: Secondary | ICD-10-CM | POA: Diagnosis not present

## 2014-03-01 DIAGNOSIS — O99820 Streptococcus B carrier state complicating pregnancy: Secondary | ICD-10-CM | POA: Insufficient documentation

## 2014-03-01 DIAGNOSIS — O9989 Other specified diseases and conditions complicating pregnancy, childbirth and the puerperium: Secondary | ICD-10-CM | POA: Diagnosis not present

## 2014-03-01 DIAGNOSIS — Z6841 Body Mass Index (BMI) 40.0 and over, adult: Secondary | ICD-10-CM | POA: Diagnosis not present

## 2014-03-01 DIAGNOSIS — Z3A39 39 weeks gestation of pregnancy: Secondary | ICD-10-CM | POA: Diagnosis not present

## 2014-03-01 HISTORY — DX: Obesity, unspecified: E66.9

## 2014-03-01 LAB — POCT FERN TEST: POCT FERN TEST: NEGATIVE

## 2014-03-01 NOTE — MAU Provider Note (Signed)
Mary Whitehead is a 24 y.o. G5P1031 at 39.0 weeks presents to MAU c/o vaginal mucus discharge.  She report sex 2 weeks ago, she denies vb w/+FM    History     Patient Active Problem List   Diagnosis Date Noted  . Fetal heart rate decelerations affecting management of mother 01/29/2014  . Decreased fetal movement   . Fetal heart deceleration 01/28/2014  . [redacted] weeks gestation of pregnancy   . Fetal tachycardia affecting management of mother   . Threatened preterm labor   . Fetal tachycardia 01/26/2014  . Fetal heart rate nonreactive 01/26/2014  . Morbid obesity 01/26/2014  . Positive GBS test 01/26/2014  . Pregnancy complicated by previous recurrent miscarriages 01/26/2014  . Adjustment disorder with mixed anxiety and depressed mood - dx'd at age 68, no meds 08/25/2013  . Migraine, unspecified, without mention of intractable migraine without mention of status migrainosus 08/25/2013  . 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 H/O: C-section in 2011 for FTP beyond 9 cm; desires VBAC 08/25/2013  . Cramping affecting pregnancy, antepartum 08/25/2013    No chief complaint on file.  HPI  OB History    Gravida Para Term Preterm AB TAB SAB Ectopic Multiple Living   Past Medical History  Diagnosis Date  . Headache(784.0) 08/02/2012    Migraines  . Anxiety   . Depression   . Obesity     Past Surgical History  Procedure Laterality Date  . Cesarean section    . Wisdom tooth extraction      Family History  Problem Relation Age of Onset  . Diabetes Father   . Hypertension Father   . Cancer Father   . Cancer Maternal Grandmother   . Diabetes Paternal Grandmother   . Hypertension Paternal Grandmother     History  Substance Use Topics  . Smoking status: Never Smoker   . Smokeless tobacco: Never Used  . Alcohol Use: Yes     Comment: occ    Allergies: No Known Allergies  Prescriptions prior to  admission  Medication Sig Dispense Refill Last Dose  . Prenatal Vit-Fe Fumarate-FA (PRENATAL MULTIVITAMIN) TABS tablet Take 1 tablet by mouth daily.    02/28/2014 at Unknown time  . butalbital-acetaminophen-caffeine (FIORICET, ESGIC) 50-325-40 MG per tablet Take 2 tablets by mouth every 6 (six) hours as needed for headache or migraine (May use 1 tablet for pain scale less than 6, 2 tablets for pain scale 6 or greater.). (Patient not taking: Reported on 03/01/2014) 14 tablet 0     ROS See HPI above, all other systems are negative  Physical Exam   Blood pressure 102/48, pulse 111, temperature 99.1 F (37.3 C), temperature source Oral, resp. rate 16, height  (1.575 m), weight 257 lb (116.574 kg).  Physical Exam Ext:  WNL ABD: Soft, non tender to palpation, no rebound or guarding SVE: 2/40/-3 by MAU nurse   ED Course  Assessment: IUP at  39.1weeks Membranes: intact Fern negative NST reactive FHR150 + accel, moderate variability, no decel, occasional ctx.   Plan: -Discussed need to follow up in office for scheduled OB visit -Bleeding and labor Precautions -Encouraged to call if any questions or concerns arise prior to next scheduled office visit.  -Discharged to home in stable condition   Yoceline Bazar, CNM, MSN 03/01/2014. 10:26 PM

## 2014-03-01 NOTE — Discharge Instructions (Signed)

## 2014-03-03 ENCOUNTER — Inpatient Hospital Stay (HOSPITAL_COMMUNITY)
Admission: AD | Admit: 2014-03-03 | Discharge: 2014-03-07 | DRG: 765 | Disposition: A | Discharge status: Home / Self Care | Payer: 59 | Source: Ambulatory Visit | Attending: Obstetrics and Gynecology | Admitting: Obstetrics and Gynecology

## 2014-03-03 ENCOUNTER — Encounter (HOSPITAL_COMMUNITY): Payer: Self-pay | Admitting: *Deleted

## 2014-03-03 DIAGNOSIS — Z6841 Body Mass Index (BMI) 40.0 and over, adult: Secondary | ICD-10-CM

## 2014-03-03 DIAGNOSIS — Z98891 History of uterine scar from previous surgery: Secondary | ICD-10-CM

## 2014-03-03 DIAGNOSIS — O99824 Streptococcus B carrier state complicating childbirth: Secondary | ICD-10-CM | POA: Diagnosis present

## 2014-03-03 DIAGNOSIS — Z8249 Family history of ischemic heart disease and other diseases of the circulatory system: Secondary | ICD-10-CM

## 2014-03-03 DIAGNOSIS — F419 Anxiety disorder, unspecified: Secondary | ICD-10-CM | POA: Diagnosis present

## 2014-03-03 DIAGNOSIS — O09293 Supervision of pregnancy with other poor reproductive or obstetric history, third trimester: Secondary | ICD-10-CM

## 2014-03-03 DIAGNOSIS — O3421 Maternal care for scar from previous cesarean delivery: Secondary | ICD-10-CM | POA: Diagnosis present

## 2014-03-03 DIAGNOSIS — O99214 Obesity complicating childbirth: Secondary | ICD-10-CM | POA: Diagnosis present

## 2014-03-03 DIAGNOSIS — Z3A39 39 weeks gestation of pregnancy: Secondary | ICD-10-CM | POA: Diagnosis present

## 2014-03-03 DIAGNOSIS — O99344 Other mental disorders complicating childbirth: Secondary | ICD-10-CM | POA: Diagnosis present

## 2014-03-03 DIAGNOSIS — Z833 Family history of diabetes mellitus: Secondary | ICD-10-CM

## 2014-03-03 DIAGNOSIS — O9081 Anemia of the puerperium: Secondary | ICD-10-CM | POA: Diagnosis present

## 2014-03-03 MED ORDER — MORPHINE SULFATE 4 MG/ML IJ SOLN
4.0000 mg | Freq: Once | INTRAMUSCULAR | Status: AC
  Administered 2014-03-03: 4 mg via INTRAMUSCULAR
  Filled 2014-03-03: qty 1

## 2014-03-03 NOTE — MAU Note (Signed)
Pt states she has been having contractions since about 1830. Pt states she had a BPP at 1430 and MD's apppointment at 1530. Pt states her membranes were swept in the office

## 2014-03-03 NOTE — MAU Provider Note (Signed)
History    Mary MausMichaela A Whitehead is a 24 y.o. G2P1001 at 39.2wks who presents, after phone call, with c/o contractions since 1830.  Patient reports she had her membranes stripped in the office today. Patient reports some "bloody mucous" but denies LOF and reports good fetal movement.  Patient is for a TOLAC and desires epidural for pain management.  Patient is also GBS positive.    Patient Active Problem List   Diagnosis Date Noted  . Fetal heart rate decelerations affecting management of mother 01/29/2014  . Decreased fetal movement   . Fetal heart deceleration 01/28/2014  . [redacted] weeks gestation of pregnancy   . Fetal tachycardia affecting management of mother   . Threatened preterm labor   . Fetal tachycardia 01/26/2014  . Fetal heart rate nonreactive 01/26/2014  . Morbid obesity 01/26/2014  . Positive GBS test 01/26/2014  . Pregnancy complicated by previous recurrent miscarriages 01/26/2014  . Adjustment disorder with mixed anxiety and depressed mood - dx'd at age 24, no meds 08/25/2013  . Migraine, unspecified, without mention of intractable migraine without mention of status migrainosus 08/25/2013  . 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 H/O: C-section in 2011 for FTP beyond 9 cm; desires VBAC 08/25/2013  . Cramping affecting pregnancy, antepartum 08/25/2013    Chief Complaint  Patient presents with  . Labor Eval   HPI  OB History    Gravida Para Term Preterm AB TAB SAB Ectopic Multiple Living   5 1 1  3  3   1       Past Medical History  Diagnosis Date  . Headache(784.0) 08/02/2012    Migraines  . Anxiety   . Depression   . Obesity     Past Surgical History  Procedure Laterality Date  . Cesarean section    . Wisdom tooth extraction      Family History  Problem Relation Age of Onset  . Diabetes Father   . Hypertension Father   . Cancer Father   . Cancer Maternal Grandmother   . Diabetes Paternal Grandmother   .  Hypertension Paternal Grandmother     History  Substance Use Topics  . Smoking status: Never Smoker   . Smokeless tobacco: Never Used  . Alcohol Use: Yes     Comment: occ    Allergies: No Known Allergies  Prescriptions prior to admission  Medication Sig Dispense Refill Last Dose  . butalbital-acetaminophen-caffeine (FIORICET, ESGIC) 50-325-40 MG per tablet Take 2 tablets by mouth every 6 (six) hours as needed for headache or migraine (May use 1 tablet for pain scale less than 6, 2 tablets for pain scale 6 or greater.). (Patient not taking: Reported on 03/01/2014) 14 tablet 0   . Prenatal Vit-Fe Fumarate-FA (PRENATAL MULTIVITAMIN) TABS tablet Take 1 tablet by mouth daily.    02/28/2014 at Unknown time    ROS  See HPI Above Physical Exam   Blood pressure 120/70, pulse 92.  Physical Exam SVE: 2/50/-2 FHR: 145 bpm, Mod Var, + Variable Decels, +Accels UC: Q1-5, palpates mild to moderate ED Course  Assessment: IUP at 39.3wks Cat II FT Contractions  Plan: -SVE as above -IM pain medication -Will reassess in 1.5-2hrs  Follow Up (0025) -SVE: 5/70/-2 -Will admit to BS   [facility] Hospital[facility], Maor Meckel LYNN CNM, MSN 03/03/2014 10:36 PM

## 2014-03-03 NOTE — MAU Note (Signed)
Pt self postioning during contraction, sitting up, yelling" Shut up" at boyfriend.

## 2014-03-04 ENCOUNTER — Encounter (HOSPITAL_COMMUNITY)
Admission: AD | Disposition: A | Discharge status: Home / Self Care | Payer: Self-pay | Source: Ambulatory Visit | Attending: Obstetrics and Gynecology

## 2014-03-04 ENCOUNTER — Encounter (HOSPITAL_COMMUNITY): Payer: Self-pay | Admitting: *Deleted

## 2014-03-04 ENCOUNTER — Inpatient Hospital Stay (HOSPITAL_COMMUNITY): Payer: 59 | Admitting: Anesthesiology

## 2014-03-04 DIAGNOSIS — Z3A39 39 weeks gestation of pregnancy: Secondary | ICD-10-CM | POA: Diagnosis present

## 2014-03-04 DIAGNOSIS — Z98891 History of uterine scar from previous surgery: Secondary | ICD-10-CM

## 2014-03-04 DIAGNOSIS — O3421 Maternal care for scar from previous cesarean delivery: Secondary | ICD-10-CM | POA: Diagnosis present

## 2014-03-04 DIAGNOSIS — O99344 Other mental disorders complicating childbirth: Secondary | ICD-10-CM | POA: Diagnosis present

## 2014-03-04 DIAGNOSIS — O09293 Supervision of pregnancy with other poor reproductive or obstetric history, third trimester: Secondary | ICD-10-CM | POA: Diagnosis not present

## 2014-03-04 DIAGNOSIS — F419 Anxiety disorder, unspecified: Secondary | ICD-10-CM | POA: Diagnosis present

## 2014-03-04 DIAGNOSIS — O9081 Anemia of the puerperium: Secondary | ICD-10-CM | POA: Diagnosis present

## 2014-03-04 DIAGNOSIS — Z6841 Body Mass Index (BMI) 40.0 and over, adult: Secondary | ICD-10-CM | POA: Diagnosis not present

## 2014-03-04 DIAGNOSIS — Z8249 Family history of ischemic heart disease and other diseases of the circulatory system: Secondary | ICD-10-CM | POA: Diagnosis not present

## 2014-03-04 DIAGNOSIS — O99824 Streptococcus B carrier state complicating childbirth: Secondary | ICD-10-CM | POA: Diagnosis present

## 2014-03-04 DIAGNOSIS — O99214 Obesity complicating childbirth: Secondary | ICD-10-CM | POA: Diagnosis present

## 2014-03-04 DIAGNOSIS — Z833 Family history of diabetes mellitus: Secondary | ICD-10-CM | POA: Diagnosis not present

## 2014-03-04 LAB — CBC
HCT: 34 % — ABNORMAL LOW (ref 36.0–46.0)
Hemoglobin: 11.2 g/dL — ABNORMAL LOW (ref 12.0–15.0)
MCH: 26 pg (ref 26.0–34.0)
MCHC: 32.9 g/dL (ref 30.0–36.0)
MCV: 78.9 fL (ref 78.0–100.0)
Platelets: 282 10*3/uL (ref 150–400)
RBC: 4.31 MIL/uL (ref 3.87–5.11)
RDW: 15 % (ref 11.5–15.5)
WBC: 18.6 10*3/uL — ABNORMAL HIGH (ref 4.0–10.5)

## 2014-03-04 LAB — PREPARE RBC (CROSSMATCH)

## 2014-03-04 LAB — RPR

## 2014-03-04 SURGERY — Surgical Case
Anesthesia: Epidural

## 2014-03-04 MED ORDER — OXYTOCIN 10 UNIT/ML IJ SOLN
40.0000 [IU] | INTRAMUSCULAR | Status: DC | PRN
  Administered 2014-03-04: 40 [IU] via INTRAVENOUS

## 2014-03-04 MED ORDER — MEPERIDINE HCL 25 MG/ML IJ SOLN
INTRAMUSCULAR | Status: AC
  Filled 2014-03-04: qty 1

## 2014-03-04 MED ORDER — OXYTOCIN 40 UNITS IN LACTATED RINGERS INFUSION - SIMPLE MED: 62.5000 mL/h | INTRAVENOUS | Status: DC

## 2014-03-04 MED ORDER — NALOXONE HCL 1 MG/ML IJ SOLN
1.0000 ug/kg/h | INTRAVENOUS | Status: DC | PRN
  Filled 2014-03-04: qty 2

## 2014-03-04 MED ORDER — LANOLIN HYDROUS EX OINT: 1.0000 "application " | TOPICAL_OINTMENT | CUTANEOUS | Status: DC | PRN

## 2014-03-04 MED ORDER — DIPHENHYDRAMINE HCL 25 MG PO CAPS
25.0000 mg | ORAL_CAPSULE | Freq: Four times a day (QID) | ORAL | Status: DC | PRN
  Filled 2014-03-04 (×2): qty 1

## 2014-03-04 MED ORDER — MEPERIDINE HCL 25 MG/ML IJ SOLN
INTRAMUSCULAR | Status: DC | PRN
  Administered 2014-03-04 (×2): 12.5 mg via INTRAVENOUS

## 2014-03-04 MED ORDER — ONDANSETRON HCL 4 MG/2ML IJ SOLN: 4.0000 mg | Freq: Three times a day (TID) | INTRAMUSCULAR | Status: DC | PRN

## 2014-03-04 MED ORDER — FENTANYL 2.5 MCG/ML BUPIVACAINE 1/10 % EPIDURAL INFUSION (WH - ANES)
14.0000 mL/h | INTRAMUSCULAR | Status: DC | PRN
  Administered 2014-03-04: 14 mL/h via EPIDURAL
  Filled 2014-03-04: qty 125

## 2014-03-04 MED ORDER — DEXAMETHASONE SODIUM PHOSPHATE 4 MG/ML IJ SOLN
INTRAMUSCULAR | Status: AC
  Filled 2014-03-04: qty 1

## 2014-03-04 MED ORDER — OXYTOCIN 40 UNITS IN LACTATED RINGERS INFUSION - SIMPLE MED: 62.5000 mL/h | INTRAVENOUS | Status: AC

## 2014-03-04 MED ORDER — ONDANSETRON HCL 4 MG/2ML IJ SOLN: 4.0000 mg | Freq: Four times a day (QID) | INTRAMUSCULAR | Status: DC | PRN

## 2014-03-04 MED ORDER — MORPHINE SULFATE (PF) 0.5 MG/ML IJ SOLN
INTRAMUSCULAR | Status: DC | PRN
  Administered 2014-03-04: 1 mg via INTRAVENOUS
  Administered 2014-03-04: 4 mg via EPIDURAL

## 2014-03-04 MED ORDER — LACTATED RINGERS IV SOLN
500.0000 mL | INTRAVENOUS | Status: DC | PRN
  Administered 2014-03-04 (×2): 500 mL via INTRAVENOUS

## 2014-03-04 MED ORDER — LIDOCAINE-EPINEPHRINE (PF) 2 %-1:200000 IJ SOLN
INTRAMUSCULAR | Status: AC
  Filled 2014-03-04: qty 20

## 2014-03-04 MED ORDER — FENTANYL CITRATE 0.05 MG/ML IJ SOLN
INTRAMUSCULAR | Status: AC
  Filled 2014-03-04: qty 2

## 2014-03-04 MED ORDER — NALBUPHINE HCL 10 MG/ML IJ SOLN
10.0000 mg | INTRAMUSCULAR | Status: DC | PRN
  Administered 2014-03-04: 10 mg via INTRAVENOUS
  Filled 2014-03-04: qty 1

## 2014-03-04 MED ORDER — EPHEDRINE 5 MG/ML INJ: 10.0000 mg | INTRAVENOUS | Status: DC | PRN

## 2014-03-04 MED ORDER — OXYTOCIN 10 UNIT/ML IJ SOLN
INTRAMUSCULAR | Status: AC
  Filled 2014-03-04: qty 4

## 2014-03-04 MED ORDER — PHENYLEPHRINE 40 MCG/ML (10ML) SYRINGE FOR IV PUSH (FOR BLOOD PRESSURE SUPPORT): 80.0000 ug | PREFILLED_SYRINGE | INTRAVENOUS | Status: DC | PRN

## 2014-03-04 MED ORDER — ZOLPIDEM TARTRATE 5 MG PO TABS: 5.0000 mg | ORAL_TABLET | Freq: Every evening | ORAL | Status: DC | PRN

## 2014-03-04 MED ORDER — PHENYLEPHRINE 40 MCG/ML (10ML) SYRINGE FOR IV PUSH (FOR BLOOD PRESSURE SUPPORT)
80.0000 ug | PREFILLED_SYRINGE | INTRAVENOUS | Status: DC | PRN
Start: 2014-03-04 — End: 2014-03-04
  Filled 2014-03-04: qty 20

## 2014-03-04 MED ORDER — DIPHENHYDRAMINE HCL 25 MG PO CAPS
25.0000 mg | ORAL_CAPSULE | ORAL | Status: DC | PRN
  Administered 2014-03-04: 25 mg via ORAL
  Filled 2014-03-04: qty 1

## 2014-03-04 MED ORDER — SODIUM BICARBONATE 8.4 % IV SOLN
INTRAVENOUS | Status: AC
  Filled 2014-03-04: qty 50

## 2014-03-04 MED ORDER — LACTATED RINGERS IV SOLN
INTRAVENOUS | Status: DC | PRN
  Administered 2014-03-04: 05:00:00 via INTRAVENOUS

## 2014-03-04 MED ORDER — ONDANSETRON HCL 4 MG PO TABS: 4.0000 mg | ORAL_TABLET | ORAL | Status: DC | PRN

## 2014-03-04 MED ORDER — CEFAZOLIN SODIUM-DEXTROSE 2-3 GM-% IV SOLR
INTRAVENOUS | Status: DC | PRN
  Administered 2014-03-04: 2 g via INTRAVENOUS

## 2014-03-04 MED ORDER — SODIUM CHLORIDE 0.9 % IV SOLN: Freq: Once | INTRAVENOUS | Status: DC

## 2014-03-04 MED ORDER — ONDANSETRON HCL 4 MG/2ML IJ SOLN: 4.0000 mg | INTRAMUSCULAR | Status: DC | PRN

## 2014-03-04 MED ORDER — SODIUM BICARBONATE 8.4 % IV SOLN
INTRAVENOUS | Status: DC | PRN
  Administered 2014-03-04 (×3): 5 mL via EPIDURAL

## 2014-03-04 MED ORDER — TETANUS-DIPHTH-ACELL PERTUSSIS 5-2.5-18.5 LF-MCG/0.5 IM SUSP: 0.5000 mL | Freq: Once | INTRAMUSCULAR | Status: DC

## 2014-03-04 MED ORDER — OXYCODONE-ACETAMINOPHEN 5-325 MG PO TABS
1.0000 | ORAL_TABLET | ORAL | Status: DC | PRN
  Administered 2014-03-04 – 2014-03-05 (×3): 1 via ORAL
  Filled 2014-03-04 (×3): qty 1

## 2014-03-04 MED ORDER — IBUPROFEN 600 MG PO TABS
600.0000 mg | ORAL_TABLET | Freq: Four times a day (QID) | ORAL | Status: DC
  Administered 2014-03-04 – 2014-03-07 (×12): 600 mg via ORAL
  Filled 2014-03-04 (×12): qty 1

## 2014-03-04 MED ORDER — NALBUPHINE HCL 10 MG/ML IJ SOLN
5.0000 mg | INTRAMUSCULAR | Status: DC | PRN
  Administered 2014-03-04: 5 mg via INTRAVENOUS
  Filled 2014-03-04: qty 1

## 2014-03-04 MED ORDER — LACTATED RINGERS IV SOLN
INTRAVENOUS | Status: DC
  Administered 2014-03-04: 01:00:00 via INTRAVENOUS

## 2014-03-04 MED ORDER — SENNOSIDES-DOCUSATE SODIUM 8.6-50 MG PO TABS
2.0000 | ORAL_TABLET | ORAL | Status: DC
  Administered 2014-03-04 – 2014-03-07 (×3): 2 via ORAL
  Filled 2014-03-04 (×5): qty 2

## 2014-03-04 MED ORDER — SIMETHICONE 80 MG PO CHEW: 80.0000 mg | CHEWABLE_TABLET | ORAL | Status: DC | PRN

## 2014-03-04 MED ORDER — SIMETHICONE 80 MG PO CHEW
80.0000 mg | CHEWABLE_TABLET | Freq: Three times a day (TID) | ORAL | Status: DC
  Administered 2014-03-04 – 2014-03-07 (×6): 80 mg via ORAL
  Filled 2014-03-04 (×12): qty 1

## 2014-03-04 MED ORDER — ACETAMINOPHEN 325 MG PO TABS: 650.0000 mg | ORAL_TABLET | ORAL | Status: DC | PRN

## 2014-03-04 MED ORDER — OXYCODONE-ACETAMINOPHEN 5-325 MG PO TABS: 1.0000 | ORAL_TABLET | ORAL | Status: DC | PRN

## 2014-03-04 MED ORDER — DIPHENHYDRAMINE HCL 50 MG/ML IJ SOLN: 12.5000 mg | INTRAMUSCULAR | Status: DC | PRN

## 2014-03-04 MED ORDER — TERBUTALINE SULFATE 1 MG/ML IJ SOLN: 0.2500 mg | Freq: Once | INTRAMUSCULAR | Status: DC

## 2014-03-04 MED ORDER — MEPERIDINE HCL 25 MG/ML IJ SOLN
6.2500 mg | INTRAMUSCULAR | Status: DC | PRN
  Administered 2014-03-04: 6.25 mg via INTRAVENOUS

## 2014-03-04 MED ORDER — LACTATED RINGERS IV SOLN
INTRAVENOUS | Status: DC | PRN
  Administered 2014-03-04 (×2): via INTRAVENOUS

## 2014-03-04 MED ORDER — OXYCODONE-ACETAMINOPHEN 5-325 MG PO TABS
2.0000 | ORAL_TABLET | ORAL | Status: DC | PRN
  Administered 2014-03-05 – 2014-03-07 (×8): 2 via ORAL
  Filled 2014-03-04 (×8): qty 2

## 2014-03-04 MED ORDER — ONDANSETRON HCL 4 MG/2ML IJ SOLN
INTRAMUSCULAR | Status: AC
  Filled 2014-03-04: qty 2

## 2014-03-04 MED ORDER — LIDOCAINE HCL (PF) 1 % IJ SOLN
INTRAMUSCULAR | Status: DC | PRN
  Administered 2014-03-04 (×2): 4 mL

## 2014-03-04 MED ORDER — LIDOCAINE HCL (PF) 1 % IJ SOLN
30.0000 mL | INTRAMUSCULAR | Status: DC | PRN
Start: 2014-03-04 — End: 2014-03-04

## 2014-03-04 MED ORDER — ONDANSETRON HCL 4 MG/2ML IJ SOLN
INTRAMUSCULAR | Status: DC | PRN
  Administered 2014-03-04: 4 mg via INTRAVENOUS

## 2014-03-04 MED ORDER — DIBUCAINE 1 % RE OINT: 1.0000 "application " | TOPICAL_OINTMENT | RECTAL | Status: DC | PRN

## 2014-03-04 MED ORDER — SODIUM CHLORIDE 0.9 % IV SOLN
2.0000 g | Freq: Once | INTRAVENOUS | Status: AC
  Administered 2014-03-04: 2 g via INTRAVENOUS
  Filled 2014-03-04: qty 2000

## 2014-03-04 MED ORDER — SCOPOLAMINE 1 MG/3DAYS TD PT72: 1.0000 | MEDICATED_PATCH | Freq: Once | TRANSDERMAL | Status: DC

## 2014-03-04 MED ORDER — SODIUM CHLORIDE 0.9 % IR SOLN
Status: DC | PRN
  Administered 2014-03-04: 1

## 2014-03-04 MED ORDER — FENTANYL 2.5 MCG/ML BUPIVACAINE 1/10 % EPIDURAL INFUSION (WH - ANES)
INTRAMUSCULAR | Status: DC | PRN
  Administered 2014-03-04: 14 mL/h via EPIDURAL

## 2014-03-04 MED ORDER — TERBUTALINE SULFATE 1 MG/ML IJ SOLN
INTRAMUSCULAR | Status: AC
  Administered 2014-03-04: 0.25 mg
  Filled 2014-03-04: qty 1

## 2014-03-04 MED ORDER — ERYTHROMYCIN 5 MG/GM OP OINT
TOPICAL_OINTMENT | OPHTHALMIC | Status: AC
  Filled 2014-03-04: qty 1

## 2014-03-04 MED ORDER — MENTHOL 3 MG MT LOZG: 1.0000 | LOZENGE | OROMUCOSAL | Status: DC | PRN

## 2014-03-04 MED ORDER — FENTANYL CITRATE 0.05 MG/ML IJ SOLN
INTRAMUSCULAR | Status: DC | PRN
  Administered 2014-03-04 (×2): 50 ug via INTRAVENOUS

## 2014-03-04 MED ORDER — MORPHINE SULFATE 0.5 MG/ML IJ SOLN
INTRAMUSCULAR | Status: AC
  Filled 2014-03-04: qty 10

## 2014-03-04 MED ORDER — LACTATED RINGERS IV SOLN: INTRAVENOUS | Status: DC

## 2014-03-04 MED ORDER — NALBUPHINE HCL 10 MG/ML IJ SOLN: 5.0000 mg | Freq: Once | INTRAMUSCULAR | Status: AC | PRN

## 2014-03-04 MED ORDER — WITCH HAZEL-GLYCERIN EX PADS: 1.0000 "application " | MEDICATED_PAD | CUTANEOUS | Status: DC | PRN

## 2014-03-04 MED ORDER — SODIUM CHLORIDE 0.9 % IJ SOLN: 3.0000 mL | INTRAMUSCULAR | Status: DC | PRN

## 2014-03-04 MED ORDER — METOCLOPRAMIDE HCL 5 MG/ML IJ SOLN: 10.0000 mg | Freq: Once | INTRAMUSCULAR | Status: DC | PRN

## 2014-03-04 MED ORDER — SIMETHICONE 80 MG PO CHEW
80.0000 mg | CHEWABLE_TABLET | ORAL | Status: DC
  Administered 2014-03-04 – 2014-03-07 (×3): 80 mg via ORAL
  Filled 2014-03-04 (×3): qty 1

## 2014-03-04 MED ORDER — NALBUPHINE HCL 10 MG/ML IJ SOLN: 5.0000 mg | INTRAMUSCULAR | Status: DC | PRN

## 2014-03-04 MED ORDER — MEPERIDINE HCL 25 MG/ML IJ SOLN: 6.2500 mg | INTRAMUSCULAR | Status: DC | PRN

## 2014-03-04 MED ORDER — SCOPOLAMINE 1 MG/3DAYS TD PT72
MEDICATED_PATCH | TRANSDERMAL | Status: DC | PRN
  Administered 2014-03-04: 1 via TRANSDERMAL

## 2014-03-04 MED ORDER — OXYTOCIN BOLUS FROM INFUSION: 500.0000 mL | INTRAVENOUS | Status: DC

## 2014-03-04 MED ORDER — LACTATED RINGERS IV SOLN
500.0000 mL | Freq: Once | INTRAVENOUS | Status: AC
  Administered 2014-03-04: 500 mL via INTRAVENOUS

## 2014-03-04 MED ORDER — CITRIC ACID-SODIUM CITRATE 334-500 MG/5ML PO SOLN
30.0000 mL | ORAL | Status: DC | PRN
  Administered 2014-03-04: 30 mL via ORAL
  Filled 2014-03-04: qty 15

## 2014-03-04 MED ORDER — FENTANYL CITRATE 0.05 MG/ML IJ SOLN: 25.0000 ug | INTRAMUSCULAR | Status: DC | PRN

## 2014-03-04 MED ORDER — SCOPOLAMINE 1 MG/3DAYS TD PT72
MEDICATED_PATCH | TRANSDERMAL | Status: AC
  Filled 2014-03-04: qty 1

## 2014-03-04 MED ORDER — OXYCODONE-ACETAMINOPHEN 5-325 MG PO TABS: 2.0000 | ORAL_TABLET | ORAL | Status: DC | PRN

## 2014-03-04 MED ORDER — PRENATAL MULTIVITAMIN CH
1.0000 | ORAL_TABLET | Freq: Every day | ORAL | Status: DC
Start: 2014-03-04 — End: 2014-03-07
  Administered 2014-03-04 – 2014-03-06 (×3): 1 via ORAL
  Filled 2014-03-04 (×3): qty 1

## 2014-03-04 MED ORDER — DEXAMETHASONE SODIUM PHOSPHATE 4 MG/ML IJ SOLN
INTRAMUSCULAR | Status: DC | PRN
  Administered 2014-03-04: 4 mg via INTRAVENOUS

## 2014-03-04 MED ORDER — NALOXONE HCL 0.4 MG/ML IJ SOLN: 0.4000 mg | INTRAMUSCULAR | Status: DC | PRN

## 2014-03-04 SURGICAL SUPPLY — 38 items
BARRIER ADHS 3X4 INTERCEED (GAUZE/BANDAGES/DRESSINGS) IMPLANT
BENZOIN TINCTURE PRP APPL 2/3 (GAUZE/BANDAGES/DRESSINGS) ×2 IMPLANT
CLAMP CORD UMBIL (MISCELLANEOUS) IMPLANT
CLOSURE STERI STRIP 1/2 X4 (GAUZE/BANDAGES/DRESSINGS) ×2 IMPLANT
CLOTH BEACON ORANGE TIMEOUT ST (SAFETY) ×2 IMPLANT
CONTAINER PREFILL 10% NBF 15ML (MISCELLANEOUS) IMPLANT
DRAPE SHEET LG 3/4 BI-LAMINATE (DRAPES) IMPLANT
DRSG OPSITE POSTOP 4X10 (GAUZE/BANDAGES/DRESSINGS) ×2 IMPLANT
DURAPREP 26ML APPLICATOR (WOUND CARE) ×2 IMPLANT
ELECT REM PT RETURN 9FT ADLT (ELECTROSURGICAL) ×2
ELECTRODE REM PT RTRN 9FT ADLT (ELECTROSURGICAL) ×1 IMPLANT
EXTRACTOR VACUUM M CUP 4 TUBE (SUCTIONS) IMPLANT
GLOVE BIOGEL M 6.5 STRL (GLOVE) ×4 IMPLANT
GLOVE BIOGEL PI IND STRL 6.5 (GLOVE) ×1 IMPLANT
GLOVE BIOGEL PI INDICATOR 6.5 (GLOVE) ×1
GOWN STRL REUS W/TWL LRG LVL3 (GOWN DISPOSABLE) ×6 IMPLANT
KIT ABG SYR 3ML LUER SLIP (SYRINGE) IMPLANT
NEEDLE HYPO 25X5/8 SAFETYGLIDE (NEEDLE) IMPLANT
NS IRRIG 1000ML POUR BTL (IV SOLUTION) ×2 IMPLANT
PACK C SECTION WH (CUSTOM PROCEDURE TRAY) ×2 IMPLANT
PAD ABD 8X10 STRL (GAUZE/BANDAGES/DRESSINGS) ×2 IMPLANT
PAD OB MATERNITY 4.3X12.25 (PERSONAL CARE ITEMS) ×2 IMPLANT
RTRCTR C-SECT PINK 25CM LRG (MISCELLANEOUS) IMPLANT
RTRCTR C-SECT PINK 34CM XLRG (MISCELLANEOUS) IMPLANT
SPONGE GAUZE 4X4 12PLY STER LF (GAUZE/BANDAGES/DRESSINGS) ×2 IMPLANT
STAPLER VISISTAT 35W (STAPLE) IMPLANT
STRIP CLOSURE SKIN 1/2X4 (GAUZE/BANDAGES/DRESSINGS) ×2 IMPLANT
SUT PDS AB 0 CT1 27 (SUTURE) ×4 IMPLANT
SUT PLAIN 0 NONE (SUTURE) IMPLANT
SUT VIC AB 0 CTX 36 (SUTURE) ×3
SUT VIC AB 0 CTX36XBRD ANBCTRL (SUTURE) ×3 IMPLANT
SUT VIC AB 2-0 CT1 27 (SUTURE) ×1
SUT VIC AB 2-0 CT1 TAPERPNT 27 (SUTURE) ×1 IMPLANT
SUT VIC AB 3-0 SH 27 (SUTURE)
SUT VIC AB 3-0 SH 27X BRD (SUTURE) IMPLANT
SUT VIC AB 4-0 KS 27 (SUTURE) ×2 IMPLANT
TOWEL OR 17X24 6PK STRL BLUE (TOWEL DISPOSABLE) ×2 IMPLANT
TRAY FOLEY CATH 14FR (SET/KITS/TRAYS/PACK) ×2 IMPLANT

## 2014-03-04 NOTE — Progress Notes (Addendum)
Subjective: Postpartum Day 0: Cesarean Delivery due to previous cesarean, NRFHR Patient has not ambulated yet, but sitting up in bed, denies dizziness. Feeding:  Bottle Contraceptive plan:  Undecided at present  Patient plans inpatient circumcision--reports baby will be on private insurance.  Objective: Vital signs in last 24 hours: Temp:  [98.2 F (36.8 C)-98.9 F (37.2 C)] 98.7 F (37.1 C) (01/06 0804) Pulse Rate:  [77-116] 82 (01/06 0804) Resp:  [12-29] 20 (01/06 0804) BP: (110-135)/(47-78) 121/57 mmHg (01/06 0804) SpO2:  [97 %-100 %] 97 % (01/06 0804) Weight:  [262 lb (118.842 kg)] 262 lb (118.842 kg) (01/06 0048)  Physical Exam:  General: alert Lochia: appropriate Uterine Fundus: firm Abdomen: faint bowel sounds, NT Incision: Dressing CDI DVT Evaluation: No evidence of DVT seen on physical exam. Negative Homan's sign.   Recent Labs  03/04/14 0040  HGB 11.2*  HCT 34.0*  WBC 18.6*    Assessment/Plan: Status post Cesarean section day 0. Doing well postoperatively., early phase  Continue current care.     Nigel BridgemanLATHAM, Macdonald Rigor CNM, [place] 03/04/2014, 9:28 AM

## 2014-03-04 NOTE — Progress Notes (Addendum)
Mary Whitehead MRN: [id number]  Subjective: -Nurse call reporting late decelerations.  In room to assess, patient with O2, s/p fluid bolus, and position changes.    Objective: BP 126/64 mmHg  Pulse 96  Temp(Src) 98.9 F (37.2 C) (Oral)  Resp 20  Ht 5\' 2"  (1.575 m)  Wt 262 lb (118.842 kg)  BMI 47.91 kg/m2  SpO2 100%     FHT: 145 bpm, Mod Var, -Decels, +Accels UC:   Q4-125min, palpates mild SVE:   Dilation: 6 Effacement (%): 80 Station: -1 Exam by:: A. Honeycutt, RN  Membranes: Intact/Bulging Pitocin: None Terbutaline Given  Assessment:  IUP at 39.3wks Cat II FT GBS Positive TOLAC  Plan: -Dr. Delrae Sawyers. Hykeem Ojeda consulted and advised as below -Give terbutaline -En route for assessment -Patient prepared for c/s -Risk discussed including but not limited to infection, bleeding, pain, and damage to other organs and/or fetus resulting in need for additional surgery. -Patient verbalizes understanding of risks and agrees to c/s   Whitehead, Mary LYNN,MSN, CNM 03/04/2014, 4:35 AM   Discussed with patient R/B/A of cesarean section . Pt desries to proceed.

## 2014-03-04 NOTE — Anesthesia Postprocedure Evaluation (Signed)
  Anesthesia Post-op Note  Patient: Mary Whitehead  Procedure(s) Performed: Procedure(s): CESAREAN SECTION (N/A)  Patient Location: PACU and Mother/Baby  Anesthesia Type:Epidural  Level of Consciousness: awake, alert  and oriented  Airway and Oxygen Therapy: Patient Spontanous Breathing  Post-op Pain: mild  Post-op Assessment: Post-op Vital signs reviewed, Patient's Cardiovascular Status Stable, Respiratory Function Stable, No signs of Nausea or vomiting, Adequate PO intake, Pain level controlled and No headache  Post-op Vital Signs: Reviewed and stable  Last Vitals:  Filed Vitals:   03/04/14 1100  BP: 111/42  Pulse: 85  Temp:   Resp: 18    Complications: No apparent anesthesia complications

## 2014-03-04 NOTE — Anesthesia Preprocedure Evaluation (Addendum)
Anesthesia Evaluation  Patient identified by MRN, date of birth, ID band Patient awake    Reviewed: Allergy & Precautions, NPO status , Patient's Chart, lab work & pertinent test results  Airway Mallampati: III  TM Distance: >3 FB Neck ROM: Full    Dental no notable dental hx. (+) Teeth Intact   Pulmonary neg pulmonary ROS,  breath sounds clear to auscultation  Pulmonary exam normal       Cardiovascular negative cardio ROS  Rhythm:Regular Rate:Normal     Neuro/Psych  Headaches, PSYCHIATRIC DISORDERS Anxiety Depression    GI/Hepatic Neg liver ROS, GERD-  Medicated,  Endo/Other  Morbid obesity  Renal/GU negative Renal ROS  negative genitourinary   Musculoskeletal negative musculoskeletal ROS (+)   Abdominal (+) + obese,   Peds  Hematology  (+) anemia ,   Anesthesia Other Findings   Reproductive/Obstetrics (+) Pregnancy                             Anesthesia Physical Anesthesia Plan  ASA: III and emergent  Anesthesia Plan: Epidural   Post-op Pain Management:    Induction:   Airway Management Planned: Natural Airway  Additional Equipment:   Intra-op Plan:   Post-operative Plan: Extubation in OR  Informed Consent: I have reviewed the patients History and Physical, chart, labs and discussed the procedure including the risks, benefits and alternatives for the proposed anesthesia with the patient or authorized representative who has indicated his/her understanding and acceptance.   Dental advisory given  Plan Discussed with: Anesthesiologist, CRNA and Surgeon  Anesthesia Plan Comments: (Patient for urgent C/Section for failure to progress and non reassuring FHR tracing. Will use epidural for C/Section.)       Anesthesia Quick Evaluation

## 2014-03-04 NOTE — Op Note (Signed)
Cesarean Section Procedure Note  Indications: non-reassuring fetal status and previous uterine incision low transverse cesarean section   Pre-operative Diagnosis: 39 week 3 day pregnancy.  Post-operative Diagnosis: same  Surgeon: Jessee AversOLE,Yasseen Salls J.   Assistant: Clyde LundborgJessica Emily CNM  Anesthesia: Spinal anesthesia  ASA Class: 2  Indication : 39 wk 3 day EGA with h/o cesarean section desired TOL however developed nonreassuring fetal heart rate and was consented for repeat cesarean section due to fetal distress.   Procedure Details   The patient was seen in the Holding Room. The risks, benefits, complications, treatment options, and expected outcomes were discussed with the patient.  The patient concurred with the proposed plan, giving informed consent.  The site of surgery properly noted/marked. The patient was taken to Operating Room # 9, identified as Mary Whitehead and the procedure verified as C-Section Delivery. A Time Out was held and the above information confirmed.  After induction of anesthesia, the patient was draped and prepped in the usual sterile manner. A Pfannenstiel incision was made and carried down through the subcutaneous tissue to the fascia. Fascial incision was made and extended transversely. The fascia was separated from the underlying rectus tissue superiorly and inferiorly. The peritoneum was identified and entered. Peritoneal incision was extended longitudinally. The utero-vesical peritoneal reflection was incised transversely and the bladder flap was bluntly freed from the lower uterine segment. A low transverse uterine incision was made. Delivered from cephalic presentation was a  Female with Apgar scores of 8 at one minute and 9 at five minutes. After the umbilical cord was clamped and cut cord blood was obtained for evaluation. The placenta was removed intact and appeared normal. The uterine outline, tubes and ovaries appeared normal. The uterine incision was closed with  running locked sutures of o vicryl . Hemostasis was observed. Lavage was carried out until clear. The fascia was then reapproximated with running sutures of 0 pds. The skin was reapproximated with 4-0 vicryl .  Instrument, sponge, and needle counts were correct prior the abdominal closure and at the conclusion of the case.   Findings: Female infant in cephalic presentation   Estimated Blood Loss:  700 mL         Drains: Foley          Total IV Fluids:  2400 ml         Specimens: Placenta and ro labor and delivery            Implants: None         Complications:  None; patient tolerated the procedure well.         Disposition: PACU - hemodynamically stable.         Condition: stable  Attending Attestation: I performed the procedure.

## 2014-03-04 NOTE — Progress Notes (Signed)
Ur chart review completed.  

## 2014-03-04 NOTE — H&P (Signed)
Mary MausMichaela A Whitehead is a 24 y.o. female, G5P1031 at 39.3 weeks, presenting for contractions.  Patient states that she had her membranes stripped in office today.  Reports contractions started at 1830 and have gradually increased in intensity prior to arriving at hospital. Denies LOF and reports bloody mucous and active fetus. Patient has history of C/S for FTP and maternal fever.  Patient desires TOLAC.  Patient is GBS positive.   Patient Active Problem List   Diagnosis Date Noted  . Fetal heart rate decelerations affecting management of mother 01/29/2014  . Decreased fetal movement   . Fetal heart deceleration 01/28/2014  . [redacted] weeks gestation of pregnancy   . Fetal tachycardia affecting management of mother   . Threatened preterm labor   . Fetal tachycardia 01/26/2014  . Fetal heart rate nonreactive 01/26/2014  . Morbid obesity 01/26/2014  . Positive GBS test 01/26/2014  . Pregnancy complicated by previous recurrent miscarriages 01/26/2014  . Adjustment disorder with mixed anxiety and depressed mood - dx'd at age 24, no meds 08/25/2013  . Migraine, unspecified, without mention of intractable migraine without mention of status migrainosus 08/25/2013  . 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 H/O: C-section in 2011 for FTP beyond 9 cm; desires VBAC 08/25/2013  . Cramping affecting pregnancy, antepartum 08/25/2013    History of present pregnancy: Patient entered care at 10.1 weeks.   EDC of 03/08/2014 was established by 9.3wk US on 08/06/2013.   Anatomy scan:  19.2 weeks, with normal findings and an posterior placenta.   Additional US evaluations:   -Anatomy: U/S complete and appears normal. Female, EFW 11oz, linear growth. Low laying placenta - 27.2wks: F/U U/S reviewed: Low lying placenta resolved. EFW 1241g, appropriate growth, frank breech. -35.2wks: VTX, BPP 8/8 IN 3 MIN, AFI 13.56, 45%ILE.  -36.2wks: AFI 13.72cm, BPP 8/8.  -36.5wks: NST:  nonreactive. BPP 8/10.  -37.2wks: U/S 6lbs 8oz 36.7%, nl AFI, AFI 11cm, BPP 8/8, vtx, post placenta.  -38.2wks: BPP 8/8. NST: reactive after 20 minutes. AFI normal at 60th % -39.2wks: BPP 8/8. AFI 14.33 Significant prenatal events:  Patient c/o cramping, pelvic pain, and contractions throughout the pregnancy.  Patient also seen in MAU for diarrhea and migraines.  Last evaluation:  03/03/2014 at 39.2wks by L. Montez Moritaarter, FNP.  FHR 153, Membranes Swept, 2/50/-3, BP 120/70, Wt 262lbs  OB History    Gravida Para Term Preterm AB TAB SAB Ectopic Multiple Living   5 1 1  3  3   1      Past Medical History  Diagnosis Date  . Headache(784.0) 08/02/2012    Migraines  . Anxiety   . Depression   . Obesity    Past Surgical History  Procedure Laterality Date  . Cesarean section    . Wisdom tooth extraction     Family History: family history includes Cancer in her father and maternal grandmother; Diabetes in her father and paternal grandmother; Hypertension in her father and paternal grandmother. Social History:  reports that she has never smoked. She has never used smokeless tobacco. She reports that she drinks alcohol. She reports that she does not use illicit drugs.   Prenatal Transfer Tool  Maternal Diabetes: No Genetic Screening: Normal Maternal Ultrasounds/Referrals: Normal Fetal Ultrasounds or other Referrals:  None Maternal Substance Abuse:  No Significant Maternal Medications:  Meds include: Other: PNV Significant Maternal Lab Results: Lab values include: Group B Strep positive    ROS:  See HPI Above  No Known  Allergies   Dilation: 5 Effacement (%): 70 Station: -2 Blood pressure 120/70, pulse 92.  FHR: 125 bpm, Mod Var, -Decels, +Accels UCs:  Q2-53min, palpates moderate  Prenatal labs: ABO, Rh: --/--/O POS, O POS (12/03 0750) Antibody: NEG (12/03 0750) Rubella:   Immune RPR:   NR HBsAg: Negative (06/05 0000)  HIV: Non-reactive (06/05 0000)  GBS: Positive, Positive,  Positive (09/28 0000) Sickle cell/Hgb electrophoresis:  Normal Pap:  Unknown GC:  Negative Chlamydia:  Negative Genetic screenings:  WNL Glucola:  Elevated 1HR, 3HR not performed Other:  None    Assessment IUP at 39.3wks Cat I FT Active Labor GBS Positive TOLAC  Plan: Admit to [facility] per consult with Dr. Delrae Sawyers Routine Labor and Delivery Orders per [facility] Protocol Okay for epidural  Phillips Climes, MSN 03/04/2014, 12:26 AM

## 2014-03-04 NOTE — Transfer of Care (Signed)
Immediate Anesthesia Transfer of Care Note  Patient: Mary Whitehead  Procedure(s) Performed: Procedure(s): CESAREAN SECTION (N/A)  Patient Location: PACU  Anesthesia Type:Epidural  Level of Consciousness: awake  Airway & Oxygen Therapy: Patient Spontanous Breathing  Post-op Assessment: Report given to PACU RN and Post -op Vital signs reviewed and stable  Post vital signs: stable  Complications: No apparent anesthesia complications

## 2014-03-04 NOTE — Anesthesia Procedure Notes (Signed)
Epidural Patient location during procedure: OB Start time: 03/04/2014 1:25 AM  Staffing Anesthesiologist: Alixander Rallis A. Performed by: anesthesiologist   Preanesthetic Checklist Completed: patient identified, site marked, surgical consent, pre-op evaluation, timeout performed, IV checked, risks and benefits discussed and monitors and equipment checked  Epidural Patient position: sitting Prep: site prepped and draped and DuraPrep Patient monitoring: continuous pulse ox and blood pressure Approach: midline Location: L3-L4 Injection technique: LOR air  Needle:  Needle type: Tuohy  Needle gauge: 17 G Needle length: 9 cm and 9 Needle insertion depth: 6 cm Catheter type: closed end flexible Catheter size: 19 Gauge Catheter at skin depth: 11 cm Test dose: negative and Other  Assessment Events: blood not aspirated, injection not painful, no injection resistance, negative IV test and no paresthesia  Additional Notes Patient identified. Risks and benefits discussed including failed block, incomplete  Pain control, post dural puncture headache, nerve damage, paralysis, blood pressure Changes, nausea, vomiting, reactions to medications-both toxic and allergic and post Partum back pain. All questions were answered. Patient expressed understanding and wished to proceed. Sterile technique was used throughout procedure. Epidural site was Dressed with sterile barrier dressing. No paresthesias, signs of intravascular injection Or signs of intrathecal spread were encountered.  Patient was more comfortable after the epidural was dosed. Please see RN's note for documentation of vital signs and FHR which are stable.

## 2014-03-05 ENCOUNTER — Encounter (HOSPITAL_COMMUNITY): Payer: Self-pay | Admitting: Obstetrics and Gynecology

## 2014-03-05 LAB — CBC
HCT: 26 % — ABNORMAL LOW (ref 36.0–46.0)
HEMOGLOBIN: 8.6 g/dL — AB (ref 12.0–15.0)
MCH: 26.6 pg (ref 26.0–34.0)
MCHC: 33.1 g/dL (ref 30.0–36.0)
MCV: 80.5 fL (ref 78.0–100.0)
Platelets: 198 10*3/uL (ref 150–400)
RBC: 3.23 MIL/uL — ABNORMAL LOW (ref 3.87–5.11)
RDW: 15.5 % (ref 11.5–15.5)
WBC: 13.6 10*3/uL — ABNORMAL HIGH (ref 4.0–10.5)

## 2014-03-05 LAB — BIRTH TISSUE RECOVERY COLLECTION (PLACENTA DONATION)

## 2014-03-05 NOTE — Anesthesia Postprocedure Evaluation (Signed)
  Anesthesia Post-op Note  Patient: Mary Whitehead  Procedure(s) Performed: Procedure(s): CESAREAN SECTION (N/A)  Patient Location: Mother/Baby  Anesthesia Type:Epidural  Level of Consciousness: awake, alert , oriented and patient cooperative  Airway and Oxygen Therapy: Patient Spontanous Breathing  Post-op Pain: mild  Post-op Assessment: Patient's Cardiovascular Status Stable, Respiratory Function Stable, No headache, No backache, No residual numbness and No residual motor weakness  Post-op Vital Signs: stable  Last Vitals:  Filed Vitals:   03/05/14 0529  BP: 114/60  Pulse: 81  Temp: 36.7 C  Resp: 18    Complications: No apparent anesthesia complications

## 2014-03-05 NOTE — Progress Notes (Signed)
Subjective: Postpartum Day 1: Cesarean Delivery Patient reports no problems voiding.  Eating small amounts.  Requesting pain medicine.  Plans to use nexplanon.  Breastfeeding.   Objective: Vital signs in last 24 hours: Temp:  [98 F (36.7 C)-98.4 F (36.9 C)] 98 F (36.7 C) (01/07 0529) Pulse Rate:  [74-102] 81 (01/07 0529) Resp:  [18-20] 18 (01/07 0529) BP: (93-114)/(43-74) 114/60 mmHg (01/07 0529) SpO2:  [96 %-99 %] 96 % (01/07 0529)  Physical Exam:  General: alert and no distress Lochia: appropriate Uterine Fundus: firm Incision: healing well, no significant drainage, dressing c/d/i DVT Evaluation: No evidence of DVT seen on physical exam.   Recent Labs  03/04/14 0040 03/05/14 0555  HGB 11.2* 8.6*  HCT 34.0* 26.0*    Assessment/Plan: Status post Cesarean section. Doing well postoperatively.  Continue current care. Plan circumcision today   Purcell NailsROBERTS,Amanie Mcculley Y 03/05/2014, 12:35 PM

## 2014-03-05 NOTE — Progress Notes (Signed)
Clinical Social Work Department PSYCHOSOCIAL ASSESSMENT - MATERNAL/CHILD 03/05/2014  Patient:  Mary Whitehead,Mary Whitehead  Account Number:  [id number]  Admit Date:  03/03/2014  Childs Name:   Mary Whitehead   Clinical Social Worker:  Romayne Ticas, CLINICAL SOCIAL WORKER   Date/Time:  03/05/2014 09:30 AM  Date Referred:  03/04/2014   Referral source  Central Nursery     Referred reason  Depression/Anxiety   Other referral source:    I:  FAMILY / HOME ENVIRONMENT Child's legal guardian:  PARENT  Guardian - Name Guardian - Age Guardian - Address  Mary Whitehead 23 [address] [place], [place] [address]  Mary Whitehead  same as above   Other household support members/support persons Name Relationship DOB  Mary Whitehead SON 2011   FOB's son 9 years old   Other support:   MOB and FOB endorsed strong support system.    II  PSYCHOSOCIAL DATA Information Source:  Family Interview  Financial and Community Resources Employment:   MOB stated that she works for [employer] and has [place] positive/supportive work environment.   Financial resources:  Medicaid If Medicaid - County:  [place] Other  Food Stamps  WIC   School / Grade:  N/[place] Maternity Care Coordinator / Child Services Coordination / Early Interventions:   None reported  Cultural issues impacting care:   None reported    III  STRENGTHS Strengths  Adequate Resources  Home prepared for Child (including basic supplies)  Supportive family/friends   Strength comment:    IV  RISK FACTORS AND CURRENT PROBLEMS Current Problem:  YES   Risk Factor & Current Problem Patient Issue Family Issue Risk Factor / Current Problem Comment  Mental Illness Y N MOB presents with history of depression and anxiety since age 13.  She reported symptoms during the pregnancy (during OB visit on 11/3), declined referrals, but reported symptom improved within [place] few weeks.    V  SOCIAL WORK ASSESSMENT CSW met with the MOB due to history of  depression/anxiety.  MOB provided consent for the CSW to complete assessment, but she was guarded and difficult to engage.  She reported feeling tired, and was observed to have her eyes closed when speaking with CSW.  The FOB was also difficult to engage as he only spoke with directly prompted and also provided short/concise responses to questions posed.  The MOB displayed [place] limited range in affect but was polite and respectful during the visit.    The MOB and FOB reported that they live together with their children (see above), and shared that the home is prepared for the baby's arrival.  The MOB acknowledged history of depression/anxiety, and confirmed diagnosis during her adolescence.  CSW shared that she was requested to meet with her due to symptoms during her pregnancy.  MOB confirmed that she had symptoms during her pregnancy due to multiple psychosocial stressors occuring at the same time. Per MOB, she was overwhelmed with [place] move, work, and concerns about the baby's heart rate. She briefly mentioned the stress associated with hospitalizations, 2 MD appointments per week, and needing to balance her medical needs with work and her role as another.  As CSW provided supportive listening and validated her feelings, CSW inquired about how she was able to effectively cope with these stressors.  She originally reported that she is "used to it" and has "learned how to deal with it" since she has had multiple years of symptoms.  When CSW continued to explore potential emotional regulation skills,   she stated that she does like to write in her journal and finds this to be helpful.  The MOB reported improvement of symptoms once she was "written out of work", and endorsed supportive work environment.  The MOB also reported feeling "better" since the baby has been born since she can see him and know about his health status. The MOB acknowledged increased risk for developing postpartum depression, but denied prior PPD.   MOB denied additional questions or concerns after CSW provided education on PPD.   No barriers to discharge.  MOB denied questions, concerns, or needs at the end of the assessment. She acknowledged ability to speak to her MD if her normal methods of coping are ineffective to address symptoms of depression and anxiety.   VI SOCIAL WORK PLAN Social Work Plan  Patient/Family Education  No Further Intervention Required / No Barriers to Discharge   Type of pt/family education:   Postpartum depression   If child protective services report - county:  [place] If child protective services report - date:  N/[place] Information/referral to community resources comment:   MOB denied need for mental health referrals at this time as she stated "I've learned how to deal with it".   Other social work plan:   CSW to follow up PRN.     

## 2014-03-07 MED ORDER — OXYCODONE-ACETAMINOPHEN 5-325 MG PO TABS: 1.0000 | ORAL_TABLET | ORAL | Status: DC | PRN

## 2014-03-07 MED ORDER — FERROUS SULFATE 325 (65 FE) MG PO TABS: 325.0000 mg | ORAL_TABLET | Freq: Two times a day (BID) | ORAL | Status: DC

## 2014-03-07 MED ORDER — IBUPROFEN 600 MG PO TABS: 600.0000 mg | ORAL_TABLET | Freq: Four times a day (QID) | ORAL | Status: DC | PRN

## 2014-03-07 NOTE — Discharge Summary (Signed)
Cesarean Section Delivery Discharge Summary  Mary MausMichaela A Erekson  DOB:    12-12-1990 MRN:    [id number] CSN:    [id number]  Date of admission:                  03/03/14  Date of discharge:                   03/06/14  Procedures this admission:  Repeat LTCS  Date of Delivery: 03/03/14  Newborn Data:  Live born female  Birth Weight: 7 lb 11.6 oz (3504 g) APGAR: 8, 9  Home with mother. Name: Mary Whitehead Circumcision Plan: Inpatient  History of Present Illness:  Ms. Mary Whitehead is a 24 y.o. female, [phone], who presents at 3854w3d weeks gestation. The patient [facility].    Her pregnancy has been complicated by:  Patient Active Problem List   Diagnosis Date Noted  . Failed trial of labor 03/04/2014  . S/P cesarean section 03/04/2014  . Status post repeat low transverse cesarean section 03/04/2014  . Threatened preterm labor   . Morbid obesity 01/26/2014  . Positive GBS test 01/26/2014  . Pregnancy complicated by previous recurrent miscarriages 01/26/2014  . Adjustment disorder with mixed anxiety and depressed mood - dx'd at age 24, no meds 08/25/2013  . Migraine, unspecified, without mention of intractable migraine without mention of status migrainosus 08/25/2013  . 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 H/O: C-section in 2011 for FTP beyond 9 cm; desires VBAC 08/25/2013    Hospital Course--Unscheduled Cesarean:  Admitted 03/03/14 in active labor, with plan for VBAC. Positive GBS.  Utilized epidural  for pain management.  [facility] she was consented for cesarean, with Dr. Richardson Doppole performing a repeat LTCS under epidural anesthesia, with delivery of a viable female, with weight and Apgars as listed below. Infant was in good condition and remained at the patient's bedside.  The patient was taken to recovery in good condition.  Patient planned to  bottlefeed.  On post-op day 1, patient was doing well, tolerating a regular diet, with Hgb of 8.6, down from 11.2 pre-delivery.  Throughout her stay, her physical exam was WNL, her incision was CDI, and her vital signs remained stable.  By post-op day 3, she was up ad lib, tolerating a regular diet, with good pain control with po med.  She was deemed to have received the full benefit of her hospital stay, and was discharged home in stable condition.  Contraceptive choice was Nexplanon.  She was using Motrin and Percocet for pain with benefit.  Feeding:  bottle  Contraception:  Nexplanon  Discharge hemoglobin:  HEMOGLOBIN  Date Value Ref Range Status  03/05/2014 8.6* 12.0 - 15.0 g/dL Final    Comment:    DELTA CHECK NOTED REPEATED TO VERIFY   03/04/2014 11.2* 12.0 - 15.0 g/dL Final  13/08/657811/10/2012 46.912.2 12.0 - 15.0 g/dL Final   HCT  Date Value Ref Range Status  03/05/2014 26.0* 36.0 - 46.0 % Final  03/04/2014 34.0* 36.0 - 46.0 % Final  01/05/2013 36.0 36.0 - 46.0 % Final   WBC  Date Value Ref Range Status  03/05/2014 13.6* 4.0 - 10.5 K/uL Final  03/04/2014 18.6* 4.0 - 10.5 K/uL Final  08/08/2011 13.2* 4.0 - 10.5 K/uL Final    Discharge Physical Exam:   General: alert Lochia: appropriate Uterine Fundus: firm Abdomen:  +  bowel sounds, NT Incision: Honeycomb dressing CDI DVT Evaluation: No evidence of DVT seen on physical exam. Negative Homan's sign.  Intrapartum Procedures: cesarean: low cervical, transverse, repeat, due to [facility] Postpartum Procedures: none Complications-Operative and Postpartum: Anemia without hemodynamic instability  Discharge Diagnoses: Term Pregnancy-delivered and previous cesearan, desired VBAC, non-reassuring FHR, anemia, GBS positive  Discharge Information:  Activity:           pelvic rest Diet:                routine Medications: Ibuprofen, Iron and Percocet Condition:      stable Instructions:  Discharge to: home  Follow-up Information     Follow up with [facility] In 5 weeks.   Specialty:  Obstetrics and Gynecology   Why:  Call for any questions or concerns.    Contact information:   [address] [place] [address] [phone]       Nigel Bridgeman [facility] 03/07/2014 8:26 AM

## 2014-03-07 NOTE — Discharge Instructions (Signed)
° °Iron-Rich Diet ° °An iron-rich diet contains foods that are good sources of iron. Iron is an important mineral that helps your body produce hemoglobin. Hemoglobin is a protein in red blood cells that carries oxygen to the body's tissues. Sometimes, the iron level in your blood can be low. This may be caused by: °· A lack of iron in your diet. °· Blood loss. °· Times of growth, such as during pregnancy or during a child's growth and development. °Low levels of iron can cause a decrease in the number of red blood cells. This can result in iron deficiency anemia. Iron deficiency anemia symptoms include: °· Tiredness. °· Weakness. °· Irritability. °· Increased chance of infection. °Here are some recommendations for daily iron intake: °· Males older than 25 years of age need 8 mg of iron per day. °· Women ages 19 to 50 need 18 mg of iron per day. °· Pregnant women need 27 mg of iron per day, and women who are over 19 years of age and breastfeeding need 9 mg of iron per day. °· Women over the age of 50 need 8 mg of iron per day. °SOURCES OF IRON °There are 2 types of iron that are found in food: heme iron and nonheme iron. Heme iron is absorbed by the body better than nonheme iron. Heme iron is found in meat, poultry, and fish. Nonheme iron is found in grains, beans, and vegetables. °Heme Iron Sources °Food / Iron (mg) °· Chicken liver, 3 oz (85 g)/ 10 mg °· Beef liver, 3 oz (85 g)/ 5.5 mg °· Oysters, 3 oz (85 g)/ 8 mg °· Beef, 3 oz (85 g)/ 2 to 3 mg °· Shrimp, 3 oz (85 g)/ 2.8 mg °· Turkey, 3 oz (85 g)/ 2 mg °· Chicken, 3 oz (85 g) / 1 mg °· Fish (tuna, halibut), 3 oz (85 g)/ 1 mg °· Pork, 3 oz (85 g)/ 0.9 mg °Nonheme Iron Sources °Food / Iron (mg) °· Ready-to-eat breakfast cereal, iron-fortified / 3.9 to 7 mg °· Tofu, ½ cup / 3.4 mg °· Kidney beans, ½ cup / 2.6 mg °· Baked potato with skin / 2.7 mg °· Asparagus, ½ cup / 2.2 mg °· Avocado / 2 mg °· Dried peaches, ½ cup / 1.6 mg °· Raisins, ½ cup / 1.5 mg °· Soy milk,  1 cup / 1.5 mg °· Whole-wheat bread, 1 slice / 1.2 mg °· Spinach, 1 cup / 0.8 mg °· Broccoli, ½ cup / 0.6 mg °IRON ABSORPTION °Certain foods can decrease the body's absorption of iron. Try to avoid these foods and beverages while eating meals with iron-containing foods: °· Coffee. °· Tea. °· Fiber. °· Soy. °Foods containing vitamin C can help increase the amount of iron your body absorbs from iron sources, especially from nonheme sources. Eat foods with vitamin C along with iron-containing foods to increase your iron absorption. Foods that are high in vitamin C include many fruits and vegetables. Some good sources are: °· Fresh orange juice. °· Oranges. °· Strawberries. °· Mangoes. °· Grapefruit. °· Red bell peppers. °· Green bell peppers. °· Broccoli. °· Potatoes with skin. °· Tomato juice. °Document Released: 09/27/2004 Document Revised: 05/08/2011 Document Reviewed: 08/04/2010 °ExitCare® Patient Information ©2015 [employer]. This information is not intended to replace advice given to you by your health care provider. Make sure you discuss any questions you have with your health care provider. ° °Postpartum Care After Cesarean Delivery ° °After you deliver your newborn (postpartum period),   the usual stay in the hospital is 24-72 hours. If there were problems with your labor or delivery, or if you have other medical problems, you might be in the hospital longer.  °While you are in the hospital, you will receive help and instructions on how to care for yourself and your newborn during the postpartum period.  °While you are in the hospital: °· It is normal for you to have pain or discomfort from the incision in your abdomen. Be sure to tell your nurses when you are having pain, where the pain is located, and what makes the pain worse. °· If you are breastfeeding, you may feel uncomfortable contractions of your uterus for a couple of weeks. This is normal. The contractions help your uterus get back to normal  size. °· It is normal to have some bleeding after delivery. °¨ For the first 1-3 days after delivery, the flow is red and the amount may be similar to a period. °¨ It is common for the flow to start and stop. °¨ In the first few days, you may pass some small clots. Let your nurses know if you begin to pass large clots or your flow increases. °¨ Do not  flush blood clots down the toilet before having the nurse look at them. °¨ During the next 3-10 days after delivery, your flow should become more watery and pink or brown-tinged in color. °¨ Ten to fourteen days after delivery, your flow should be a small amount of yellowish-white discharge. °¨ The amount of your flow will decrease over the first few weeks after delivery. Your flow may stop in 6-8 weeks. Most women have had their flow stop by 12 weeks after delivery. °· You should change your sanitary pads frequently. °· Wash your hands thoroughly with soap and water for at least 20 seconds after changing pads, using the toilet, or before holding or feeding your newborn. °· Your intravenous (IV) tubing will be removed when you are drinking enough fluids. °· The urine drainage tube (urinary catheter) that was inserted before delivery may be removed within 6-8 hours after delivery or when feeling returns to your legs. You should feel like you need to empty your bladder within the first 6-8 hours after the catheter has been removed. °· In case you become weak, lightheaded, or faint, call your nurse before you get out of bed for the first time and before you take a shower for the first time. °· Within the first few days after delivery, your breasts may begin to feel tender and full. This is called engorgement. Breast tenderness usually goes away within 48-72 hours after engorgement occurs. You may also notice milk leaking from your breasts. If you are not breastfeeding, do not stimulate your breasts. Breast stimulation can make your breasts produce more milk. °· Spending  as much time as possible with your newborn is very important. During this time, you and your newborn can feel close and get to know each other. Having your newborn stay in your room (rooming in) will help to strengthen the bond with your newborn. It will give you time to get to know your newborn and become comfortable caring for your newborn. °· Your hormones change after delivery. Sometimes the hormone changes can temporarily cause you to feel sad or tearful. These feelings should not last more than a few days. If these feelings last longer than that, you should talk to your caregiver. °· If desired, talk to your caregiver about methods of   family planning or contraception. °· Talk to your caregiver about immunizations. Your caregiver may want you to have the following immunizations before leaving the hospital: °¨ Tetanus, diphtheria, and pertussis (Tdap) or tetanus and diphtheria (Td) immunization. It is very important that you and your family (including grandparents) or others caring for your newborn are up-to-date with the Tdap or Td immunizations. The Tdap or Td immunization can help protect your newborn from getting ill. °¨ Rubella immunization. °¨ Varicella (chickenpox) immunization. °¨ Influenza immunization. You should receive this annual immunization if you did not receive the immunization during your pregnancy. °Document Released: 11/08/2011 Document Reviewed: 11/08/2011 °ExitCare® Patient Information ©2015 [employer]. This information is not intended to replace advice given to you by your health care provider. Make sure you discuss any questions you have with your health care provider. ° °

## 2014-03-08 LAB — TYPE AND SCREEN
ABO/RH(D): O POS
Antibody Screen: NEGATIVE
Unit division: 0
Unit division: 0

## 2014-03-10 LAB — HIV ANTIBODY (ROUTINE TESTING W REFLEX)
HIV 1/HIV 2 AB: NONREACTIVE
HIV 1/O/2 Abs-Index Value: <1 (ref <1.00)

## 2014-09-30 ENCOUNTER — Emergency Department (INDEPENDENT_AMBULATORY_CARE_PROVIDER_SITE_OTHER)
Admission: EM | Admit: 2014-09-30 | Discharge: 2014-09-30 | Disposition: A | Discharge status: Home / Self Care | Payer: 59 | Source: Home / Self Care | Attending: Family Medicine | Admitting: Family Medicine

## 2014-09-30 ENCOUNTER — Encounter (HOSPITAL_COMMUNITY): Payer: Self-pay | Admitting: Emergency Medicine

## 2014-09-30 DIAGNOSIS — R1031 Right lower quadrant pain: Secondary | ICD-10-CM

## 2014-09-30 LAB — POCT URINALYSIS DIP (DEVICE)
Bilirubin Urine: NEGATIVE
Glucose, UA: NEGATIVE mg/dL
Hgb urine dipstick: NEGATIVE
Ketones, ur: NEGATIVE mg/dL
Leukocytes, UA: NEGATIVE
Nitrite: NEGATIVE
PH: 6.5 (ref 5.0–8.0)
PROTEIN: NEGATIVE mg/dL
Specific Gravity, Urine: 1.025 (ref 1.005–1.030)
UROBILINOGEN UA: 1 mg/dL (ref 0.0–1.0)

## 2014-09-30 LAB — POCT PREGNANCY, URINE: Preg Test, Ur: NEGATIVE

## 2014-09-30 MED ORDER — ONDANSETRON HCL 4 MG PO TABS: 4.0000 mg | ORAL_TABLET | Freq: Three times a day (TID) | ORAL | Status: DC | PRN

## 2014-09-30 NOTE — Discharge Instructions (Signed)
The cause of your abdominal pain is not immediately clear. Please use the zofran for discomfort. Please drink plenty fluids and use ibuprofen or Advil for your pain. If her pain becomes significantly worse she'll need a good emergency room as he may have appendicitis for gallstones. This is likely related to mild viral infection and will go away within another 2-3 days.

## 2014-09-30 NOTE — ED Provider Notes (Signed)
CSN: [id number]     Arrival date & time 09/30/14  1332 History   First MD Initiated Contact with Patient 09/30/14 1442     Chief Complaint  Patient presents with  . Abdominal Pain   (Consider location/radiation/quality/duration/timing/severity/associated sxs/prior Treatment) HPI   3 days of RLQ abd pain. Constant. Associated w/ diarrhea, frequency. Getting worse. Worse w/ palpation, very little po during this time. Tylenol w/o benefit. Denies fevers, nausea, vomiting, dysuria, rash, chest pain, shortness of breath, palpitations..  Sexually active w/o condoms. Denies vaginal discharge.   2 weeks ago son w/ viral gastro.   Past Medical History  Diagnosis Date  . Headache(784.0) 08/02/2012    Migraines  . Anxiety   . Depression   . Obesity    Past Surgical History  Procedure Laterality Date  . Cesarean section    . Wisdom tooth extraction    . Cesarean section N/A 03/04/2014    Procedure: CESAREAN SECTION;  Surgeon: Dorien Chihuahua. Richardson Dopp, MD;  Location: [facility];  Service: Obstetrics;  Laterality: N/A;   Family History  Problem Relation Age of Onset  . Diabetes Father   . Hypertension Father   . Cancer Father   . Cancer Maternal Grandmother   . Diabetes Paternal Grandmother   . Hypertension Paternal Grandmother    History  Substance Use Topics  . Smoking status: Never Smoker   . Smokeless tobacco: Never Used  . Alcohol Use: Yes     Comment: occ   OB History    Gravida Para Term Preterm AB TAB SAB Ectopic Multiple Living   0 1     Review of Systems Per HPI with all other pertinent systems negative.   Allergies  Review of patient's allergies indicates no known allergies.  Home Medications   Prior to Admission medications   Medication Sig Start Date End Date Taking? Authorizing Provider  ferrous sulfate (FERROUSUL) 325 (65 FE) MG tablet Take 1 tablet (325 mg total) by mouth 2 (two) times daily with a meal. 03/07/14   Nigel Bridgeman, CNM  ibuprofen (ADVIL,MOTRIN) 600  MG tablet Take 1 tablet (600 mg total) by mouth every 6 (six) hours as needed. 03/07/14   Nigel Bridgeman, CNM  ondansetron (ZOFRAN) 4 MG tablet Take 1 tablet (4 mg total) by mouth every 8 (eight) hours as needed for nausea or vomiting. 09/30/14   Ozella Rocks, MD  oxyCODONE-acetaminophen (PERCOCET/ROXICET) 5-325 MG per tablet Take 1 tablet by mouth every 4 (four) hours as needed (for pain scale less than 7). 03/07/14   Nigel Bridgeman, CNM  Prenatal Vit-Fe Fumarate-FA (PRENATAL MULTIVITAMIN) TABS tablet Take 1 tablet by mouth daily.     Historical Provider, MD   BP 130/83 mmHg  Pulse 81  Temp(Src) 99.8 F (37.7 C) (Oral)  Resp 16  SpO2 100% Physical Exam Physical Exam  Constitutional: oriented to person, place, and time. appears well-developed and well-nourished. No distress.  HENT:  Head: Normocephalic and atraumatic.  Eyes: EOMI. PERRL.  Neck: Normal range of motion.  Cardiovascular: RRR, no m/r/g, 2+ distal pulses,  Pulmonary/Chest: Effort normal and breath sounds normal. No respiratory distress.  Abdominal: Soft. Normal active bowel sounds, nondistended, minimal diffuse right lower quadrant tenderness to palpation. Negative Murphy sign. Nontender at McBurney's point.  Musculoskeletal: Normal range of motion. Non ttp, no effusion.  Neurological: alert and oriented to person, place, and time.  Skin: Skin is warm. No rash noted. non diaphoretic.  Psychiatric: normal mood  and affect. behavior is normal. Judgment and thought content normal.   ED Course  Procedures (including critical care time) Labs Review Labs Reviewed  URINE CULTURE  POCT URINALYSIS DIP (DEVICE)  POCT PREGNANCY, URINE    Imaging Review No results found.   MDM   1. Right lower quadrant abdominal pain    Etiology not immediately clear but may be due to mild viral gastroenteritis versus early appendicitis versus cholecystitis. Urinalysis completely normal. Urine pregnancy negative. Will send urine culture. Start  Zofran and stay well-hydrated. Patient to go to the ED if she gets significantly worse.    Ozella Rocks, MD 09/30/14 [phone]

## 2014-09-30 NOTE — ED Notes (Signed)
Right lower quadrant abdominal pain.  Onset Monday of pain in right side.  Reports frequent urination and soft bm.  Soft bm started yesterday.  Denies vaginal discharge

## 2014-10-02 LAB — URINE CULTURE

## 2014-10-05 NOTE — ED Notes (Signed)
Final report for UA culture positive for multiple species, inconclusive/negative

## 2014-12-17 ENCOUNTER — Emergency Department (HOSPITAL_COMMUNITY): Payer: 59

## 2014-12-17 ENCOUNTER — Encounter (HOSPITAL_COMMUNITY): Payer: Self-pay | Admitting: Emergency Medicine

## 2014-12-17 ENCOUNTER — Emergency Department (HOSPITAL_COMMUNITY)
Admission: EM | Admit: 2014-12-17 | Discharge: 2014-12-17 | Disposition: A | Discharge status: Home / Self Care | Payer: 59 | Attending: Emergency Medicine | Admitting: Emergency Medicine

## 2014-12-17 DIAGNOSIS — Z791 Long term (current) use of non-steroidal anti-inflammatories (NSAID): Secondary | ICD-10-CM | POA: Insufficient documentation

## 2014-12-17 DIAGNOSIS — Z3202 Encounter for pregnancy test, result negative: Secondary | ICD-10-CM | POA: Insufficient documentation

## 2014-12-17 DIAGNOSIS — F419 Anxiety disorder, unspecified: Secondary | ICD-10-CM | POA: Insufficient documentation

## 2014-12-17 DIAGNOSIS — G43109 Migraine with aura, not intractable, without status migrainosus: Secondary | ICD-10-CM | POA: Diagnosis not present

## 2014-12-17 DIAGNOSIS — E669 Obesity, unspecified: Secondary | ICD-10-CM | POA: Insufficient documentation

## 2014-12-17 DIAGNOSIS — F329 Major depressive disorder, single episode, unspecified: Secondary | ICD-10-CM | POA: Insufficient documentation

## 2014-12-17 DIAGNOSIS — G43909 Migraine, unspecified, not intractable, without status migrainosus: Secondary | ICD-10-CM | POA: Diagnosis present

## 2014-12-17 DIAGNOSIS — Z79899 Other long term (current) drug therapy: Secondary | ICD-10-CM | POA: Insufficient documentation

## 2014-12-17 LAB — URINALYSIS, ROUTINE W REFLEX MICROSCOPIC
Bilirubin Urine: NEGATIVE
Glucose, UA: NEGATIVE mg/dL
Hgb urine dipstick: NEGATIVE
KETONES UR: NEGATIVE mg/dL
NITRITE: NEGATIVE
PROTEIN: NEGATIVE mg/dL
Specific Gravity, Urine: 1.014 (ref 1.005–1.030)
Urobilinogen, UA: 0.2 mg/dL (ref 0.0–1.0)
pH: 7.5 (ref 5.0–8.0)

## 2014-12-17 LAB — URINE MICROSCOPIC-ADD ON

## 2014-12-17 LAB — PREGNANCY, URINE: Preg Test, Ur: NEGATIVE

## 2014-12-17 MED ORDER — SODIUM CHLORIDE 0.9 % IV BOLUS (SEPSIS)
1000.0000 mL | Freq: Once | INTRAVENOUS | Status: AC
  Administered 2014-12-17: 1000 mL via INTRAVENOUS

## 2014-12-17 MED ORDER — DEXAMETHASONE SODIUM PHOSPHATE 10 MG/ML IJ SOLN
10.0000 mg | Freq: Once | INTRAMUSCULAR | Status: AC
Start: 2014-12-17 — End: 2014-12-17
  Administered 2014-12-17: 10 mg via INTRAVENOUS
  Filled 2014-12-17: qty 1

## 2014-12-17 MED ORDER — DIPHENHYDRAMINE HCL 50 MG/ML IJ SOLN
25.0000 mg | Freq: Once | INTRAMUSCULAR | Status: AC
  Administered 2014-12-17: 25 mg via INTRAVENOUS
  Filled 2014-12-17: qty 1

## 2014-12-17 MED ORDER — METOCLOPRAMIDE HCL 5 MG/ML IJ SOLN
10.0000 mg | Freq: Once | INTRAMUSCULAR | Status: AC
  Administered 2014-12-17: 10 mg via INTRAVENOUS
  Filled 2014-12-17: qty 2

## 2014-12-17 NOTE — ED Notes (Signed)
Pt states she has hx of migraines and started having one this morning. Pt also states her left arm started feeling heavy/tingly at 0700. Pt states she feels like she is weaker in the left arm. Pt also states she is having some blurred vision with this migraine.

## 2014-12-17 NOTE — ED Provider Notes (Signed)
CSN: [id number]     Arrival date & time 12/17/14  0820 History   First MD Initiated Contact with Patient 12/17/14 [phone]     Chief Complaint  Patient presents with  . Migraine     (Consider location/radiation/quality/duration/timing/severity/associated sxs/prior Treatment) The history is provided by the patient.   Mary Whitehead is a 24 yo F PMH migraines is presenting with a migraine headache today. The migraine started about 7 AM this morning and had a gradual increase in pain. Pain occurring behind both eyes and throbbing in nature. Ports the pain is 10 out of 10 and is also having photophobia, phonophobia and auras. She is chronically on Topamax which she has not taken today and also naproxen which she has taken with no relief. She denies any tobacco use, alcohol use, and no travel. She has a nexplanon that has been in place since March. She started having some numbness on the lateral aspect of her left arm and her left foot. She has not had any prior numbness with previous migraines. She denies any prior shoulder or neck injury or surgery. She is having some nausea, dizziness and intermittent sharp chest pain. She denies any shortness of breath, abdominal pain, vomiting, constipation, diarrhea, dysuria, fever, chills, rashes, or abnormal joint swelling.  Past Medical History  Diagnosis Date  . Headache(784.0) 08/02/2012    Migraines  . Anxiety   . Depression   . Obesity    Past Surgical History  Procedure Laterality Date  . Cesarean section    . Wisdom tooth extraction    . Cesarean section N/A 03/04/2014    Procedure: CESAREAN SECTION;  Surgeon: Dorien Chihuahuaara J. Richardson Doppole, MD;  Location: [facility];  Service: Obstetrics;  Laterality: N/A;   Family History  Problem Relation Age of Onset  . Diabetes Father   . Hypertension Father   . Cancer Father   . Cancer Maternal Grandmother   . Diabetes Paternal Grandmother   . Hypertension Paternal Grandmother    Social History  Substance Use Topics  . Smoking  status: Never Smoker   . Smokeless tobacco: Never Used  . Alcohol Use: Yes     Comment: occ   OB History    Gravida Para Term Preterm AB TAB SAB Ectopic Multiple Living   5 2 2  3  3   0 1     Review of Systems  Constitutional: Negative for fever and chills.  HENT: Negative for hearing loss.   Respiratory: Negative for shortness of breath.   Gastrointestinal: Negative for nausea, vomiting, abdominal pain, diarrhea and constipation.  Genitourinary: Negative for dysuria.  Musculoskeletal: Negative for joint swelling.  Skin: Negative for rash.  Neurological: Positive for numbness and headaches. Negative for facial asymmetry.  Psychiatric/Behavioral: Negative for behavioral problems.      Allergies  Review of patient's allergies indicates no known allergies.  Home Medications   Prior to Admission medications   Medication Sig Start Date End Date Taking? Authorizing Provider  escitalopram (LEXAPRO) 20 MG tablet Take 20 mg by mouth daily.   Yes Historical Provider, MD  naproxen (NAPROSYN) 500 MG tablet Take 500 mg by mouth 2 (two) times daily with a meal.   Yes Historical Provider, MD  ondansetron (ZOFRAN) 4 MG tablet Take 1 tablet (4 mg total) by mouth every 8 (eight) hours as needed for nausea or vomiting. 09/30/14  Yes Ozella Rocksavid J Merrell, MD  topiramate (TOPAMAX) 25 MG tablet Take 25 mg by mouth daily.   Yes Historical Provider, MD  ferrous sulfate (FERROUSUL) 325 (65 FE) MG tablet Take 1 tablet (325 mg total) by mouth 2 (two) times daily with a meal. Patient not taking: Reported on 12/17/2014 03/07/14   Nigel Bridgeman, CNM   BP 103/62 mmHg  Pulse 83  Temp(Src) 98.5 F (36.9 C) (Oral)  Resp 18  Ht  (1.575 m)  Wt 230 lb (104.327 kg)  BMI 42.06 kg/m2  SpO2 97% Physical Exam  Constitutional: She is oriented to person, place, and time. She appears well-developed and well-nourished.  HENT:  Head: Normocephalic and atraumatic.  Eyes: Conjunctivae and EOM are normal. Pupils are  equal, round, and reactive to light.  Neck: Normal range of motion. Neck supple.  Cardiovascular: Normal rate, regular rhythm, normal heart sounds and intact distal pulses.   No murmur heard. Pulmonary/Chest: Effort normal and breath sounds normal. No respiratory distress. She has no wheezes.  Abdominal: Soft. Bowel sounds are normal. She exhibits no distension. There is no tenderness.  Musculoskeletal: Normal range of motion. She exhibits no edema.  Left hand numbness but normal grip strength  Numbness of left forearm on lateral aspect but medial is normal.  Numbness on left foot compared to right  Normal plantar and dorsal flexion b/l   Neurological: She is alert and oriented to person, place, and time. No cranial nerve deficit.  Normal finger to nose testing Normal alternating rapid hand movements  Normal heel to shin b/l  Negative babinski b/l    Skin: Skin is warm. No rash noted.  Psychiatric: She has a normal mood and affect.    ED Course  Procedures (including critical care time) Labs Review Labs Reviewed  URINALYSIS, ROUTINE W REFLEX MICROSCOPIC (NOT AT [facility]) - Abnormal; Notable for the following:    APPearance HAZY (*)    Leukocytes, UA TRACE (*)    All other components within normal limits  URINE MICROSCOPIC-ADD ON - Abnormal; Notable for the following:    Squamous Epithelial / LPF FEW (*)    Bacteria, UA FEW (*)    All other components within normal limits  PREGNANCY, URINE    Imaging Review Mr Brain Wo Contrast (neuro Protocol)  12/17/2014  CLINICAL DATA:  Headaches.  Left arm weakness and tingling. EXAM: MRI HEAD WITHOUT CONTRAST MRA HEAD WITHOUT CONTRAST TECHNIQUE: Multiplanar, multiecho pulse sequences of the brain and surrounding structures were obtained without intravenous contrast. Angiographic images of the head were obtained using MRA technique without contrast. COMPARISON:  None. FINDINGS: MRI HEAD FINDINGS Ventricle size is normal. Cerebral volume is  normal. Pituitary normal in size. Negative for Chiari malformation. Negative for acute or chronic infarction. Negative for demyelinating disease. Brainstem and cerebellum normal. Negative for hemorrhage.  Negative for mass or edema. Mucous retention cyst left maxillary sinus. Mild mucosal thickening in the right maxillary sinus. MRA HEAD FINDINGS Both vertebral arteries patent to the basilar. Right PICA patent. Left PICA not visualized. Basilar widely patent. Superior cerebellar and posterior cerebral arteries patent bilaterally Internal carotid artery widely patent bilaterally. Hypoplastic right A1 segment. Both anterior cerebral arteries are patent and supplied from the left. Middle cerebral arteries patent bilaterally without stenosis Negative for cerebral aneurysm. IMPRESSION: Normal MRI and MRA of the head Sinus mucosal disease. Electronically Signed   By: Marlan Palau M.D.   On: 12/17/2014 14:47   Mr Shirlee Latch (cerebral Arteries)  12/17/2014  CLINICAL DATA:  Headaches.  Left arm weakness and tingling. EXAM: MRI HEAD WITHOUT CONTRAST MRA HEAD WITHOUT CONTRAST TECHNIQUE: Multiplanar, multiecho pulse sequences  of the brain and surrounding structures were obtained without intravenous contrast. Angiographic images of the head were obtained using MRA technique without contrast. COMPARISON:  None. FINDINGS: MRI HEAD FINDINGS Ventricle size is normal. Cerebral volume is normal. Pituitary normal in size. Negative for Chiari malformation. Negative for acute or chronic infarction. Negative for demyelinating disease. Brainstem and cerebellum normal. Negative for hemorrhage.  Negative for mass or edema. Mucous retention cyst left maxillary sinus. Mild mucosal thickening in the right maxillary sinus. MRA HEAD FINDINGS Both vertebral arteries patent to the basilar. Right PICA patent. Left PICA not visualized. Basilar widely patent. Superior cerebellar and posterior cerebral arteries patent bilaterally Internal carotid  artery widely patent bilaterally. Hypoplastic right A1 segment. Both anterior cerebral arteries are patent and supplied from the left. Middle cerebral arteries patent bilaterally without stenosis Negative for cerebral aneurysm. IMPRESSION: Normal MRI and MRA of the head Sinus mucosal disease. Electronically Signed   By: Marlan Palau M.D.   On: 12/17/2014 14:47   I have personally reviewed and evaluated these images and lab results as part of my medical decision-making.   EKG Interpretation None      Medications  sodium chloride 0.9 % bolus 1,000 mL (0 mLs Intravenous Stopped 12/17/14 1445)  dexamethasone (DECADRON) injection 10 mg (10 mg Intravenous Given 12/17/14 0920)  metoCLOPramide (REGLAN) injection 10 mg (10 mg Intravenous Given 12/17/14 0919)  diphenhydrAMINE (BENADRYL) injection 25 mg (25 mg Intravenous Given 12/17/14 0919)     MDM   Final diagnoses:  Migraine with aura and without status migrainosus, not intractable   Mary Whitehead is 24 yo F presenting with migraine. Having some new neurological symptoms (numbness and weakness) that she has not previously had. MRI/MRA was normal. Improvement of her pain (10/10 to 5/10) with medications. Most likely a complex migraine. Patient stable for discharge.   Myra Rude, MD PGY-3, [facility] 12/17/2014, 2:59 PM      Myra Rude, MD 12/17/14 1459  Arby Barrette, MD 12/28/14 [phone]

## 2014-12-17 NOTE — ED Notes (Signed)
Notified MRI that pt is ok for transport

## 2014-12-17 NOTE — Discharge Instructions (Signed)

## 2014-12-17 NOTE — ED Notes (Signed)
Pt in MRI.

## 2016-03-27 IMAGING — US US OB LIMITED
1 series · 13 of 28 positions shown · non-contrast
Comparison: none

[Series 1: us fetal bpp w/o nonstress · non-contrast · 65 acquisitions, 13 frames shown]
[im 3/65]
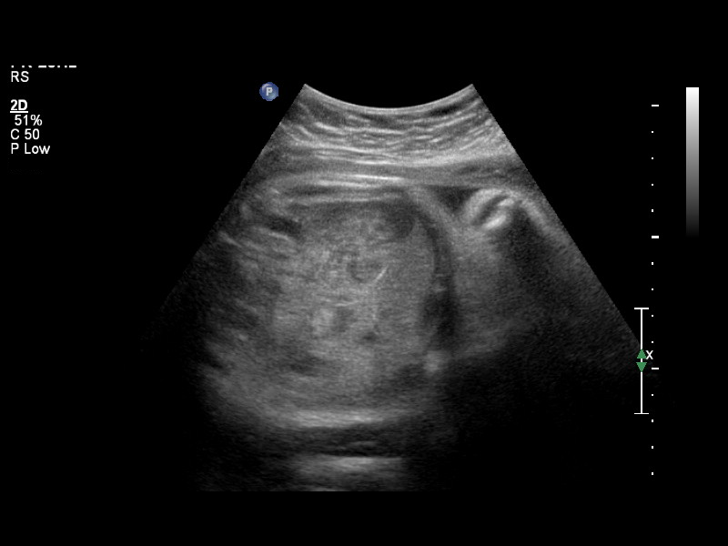
[im 8/65]
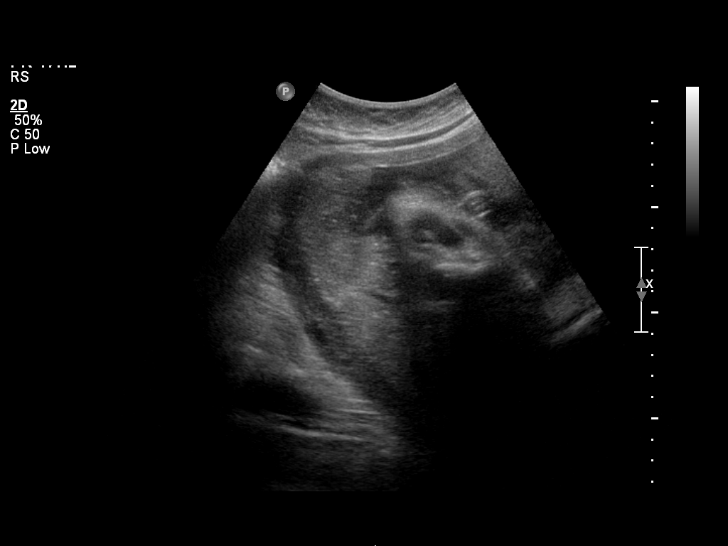
[im 12/65]
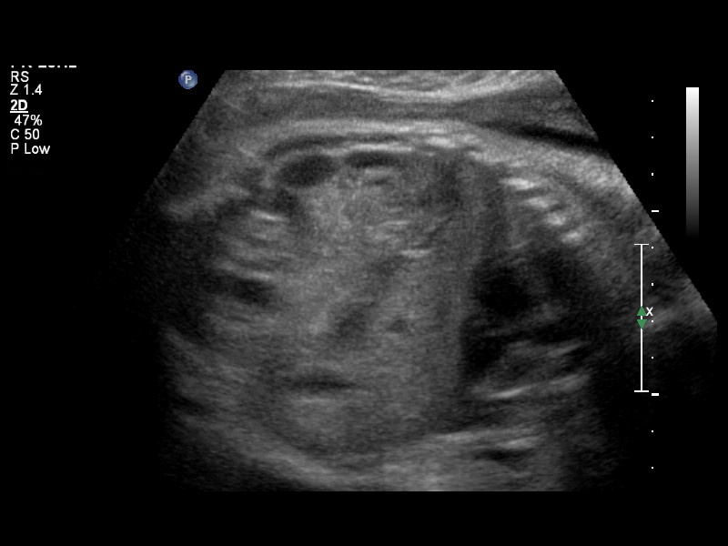
[im 17/65]
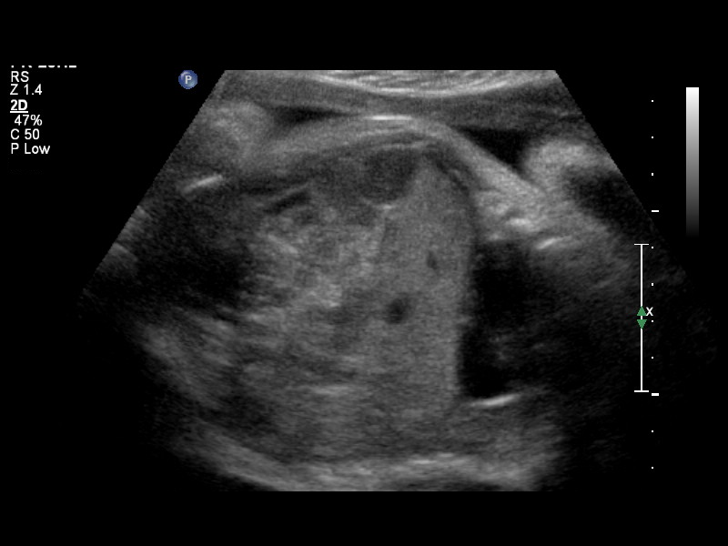
[im 22/65]
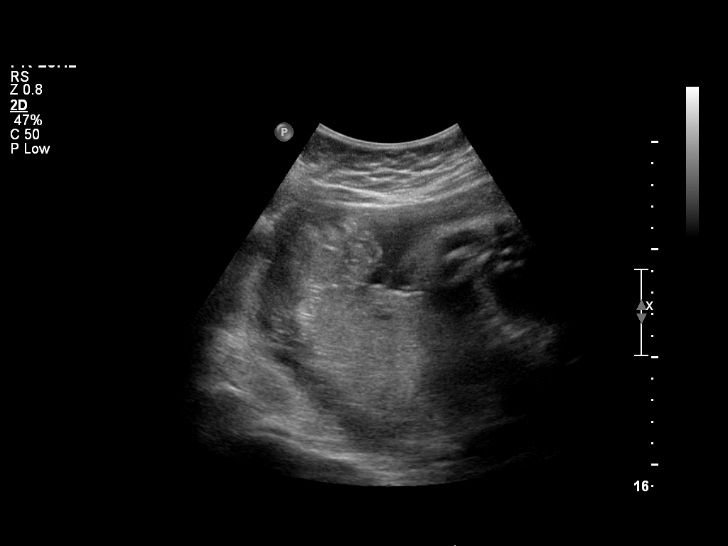
[im 27/65]
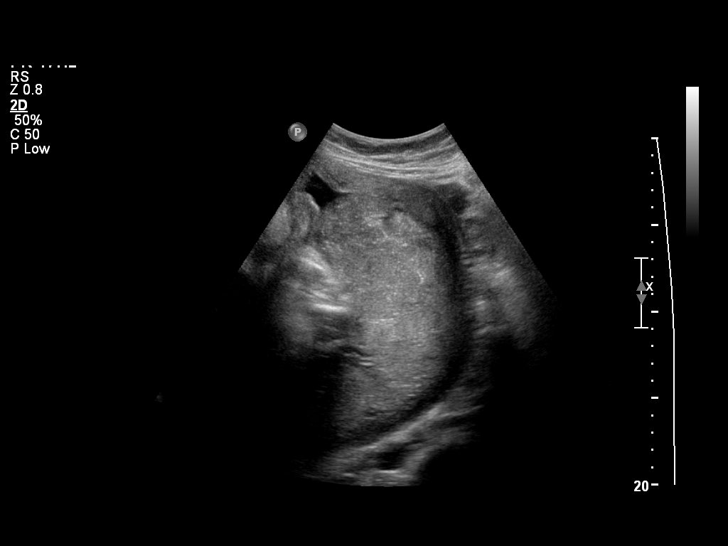
[im 34/65]
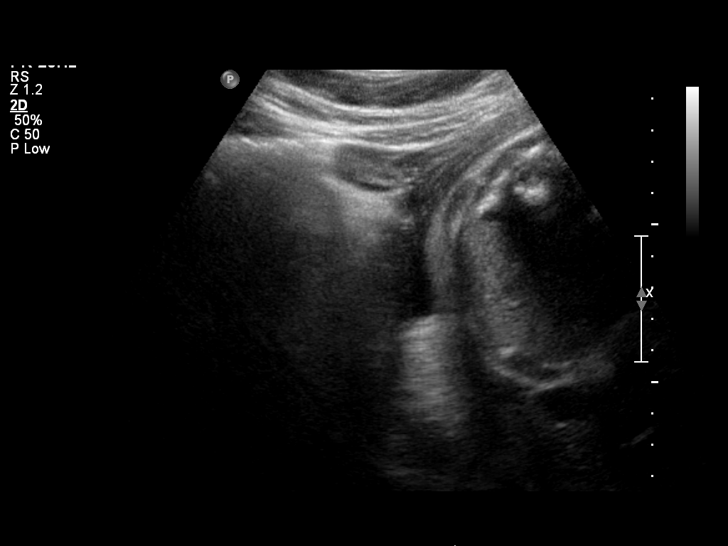
[im 38/65]
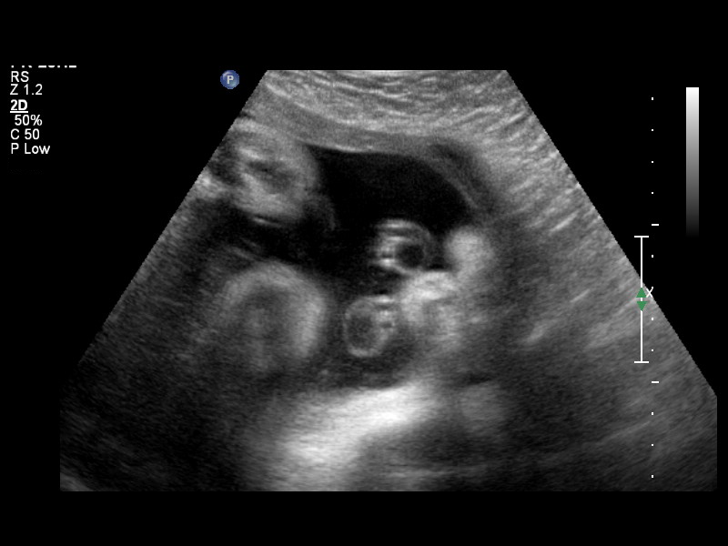
[im 43/65]
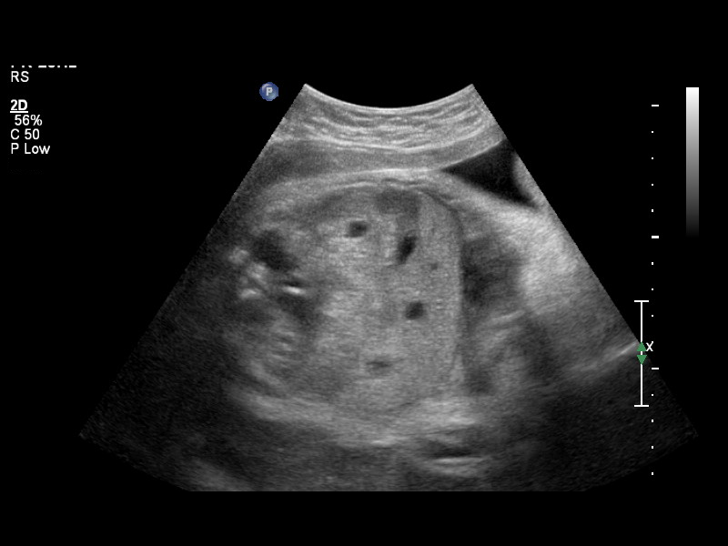
[im 48/65]
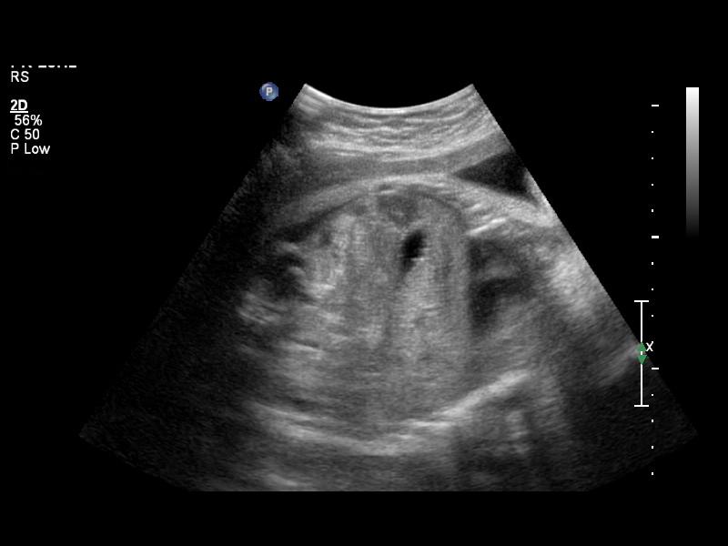
[im 53/65]
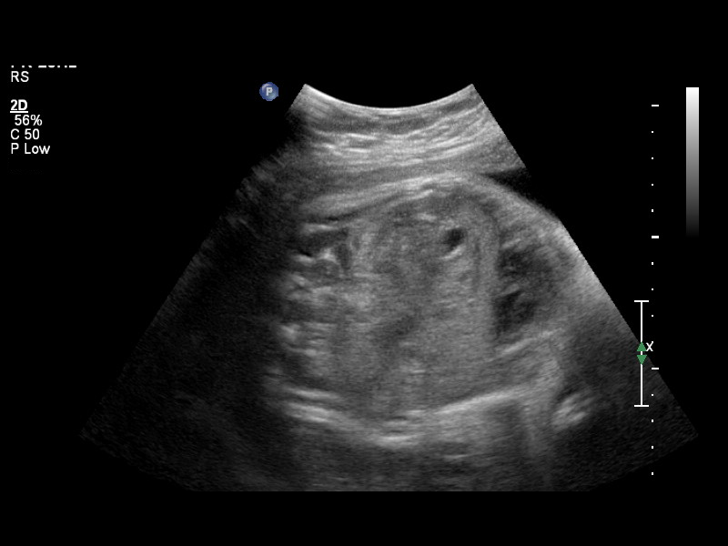
[im 57/65]
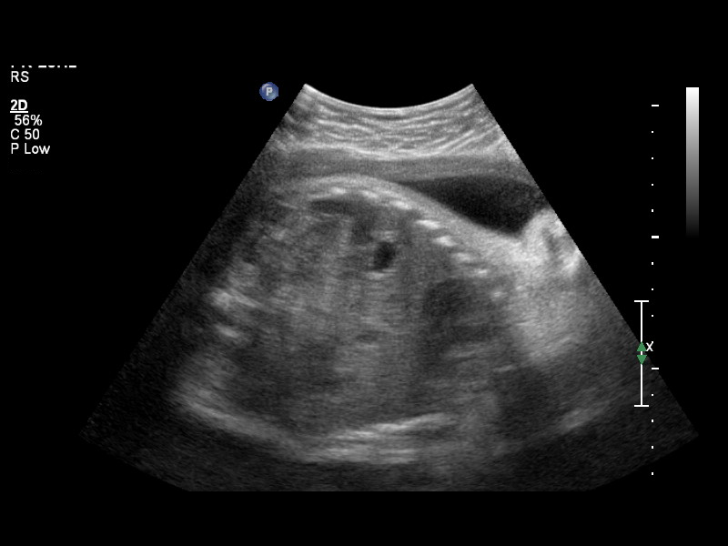
[im 62/65]
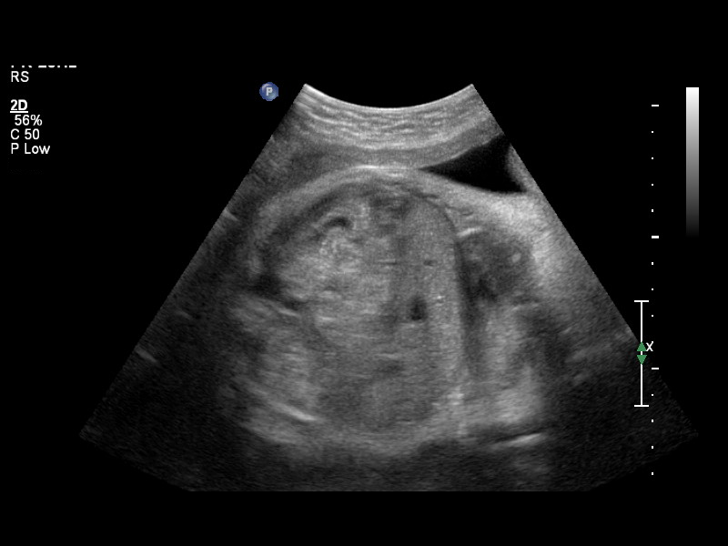

[13 of 28 positions shown; findings below may reference images not displayed]

OBSTETRICS REPORT
                      (Signed Final 01/29/2014 [DATE])

 Name:       FERIENHAUS ERXLEBEN                Visit Date: 01/28/2014 [DATE]

Service(s) Provided

 [HOSPITAL]                                         76815.0
Indications

 34 weeks gestation of pregnancy
 Preterm labor without delivery, third trimester
 Abnormal fetal heart rate/rhythm                      O76
 Non-reactive NST, FHR decelerations
Fetal Evaluation

 Num Of Fetuses:    1
 Fetal Heart Rate:  135                          bpm
 Cardiac Activity:  Observed
 Presentation:      Cephalic
 Placenta:          Posterior, above cervical
                    os
 P. Cord            Not well visualized
 Insertion:

 Amniotic Fluid
 AFI FV:      Subjectively within normal limits
 AFI Sum:     12.55   cm       38  %Tile     Larg Pckt:    4.19  cm
 RUQ:   3.41    cm   RLQ:    2.65   cm    LUQ:   4.19    cm   LLQ:    2.3    cm
Biophysical Evaluation

 Amniotic F.V:   Within normal limits       F. Tone:        Observed
 F. Movement:    Observed                   Score:          [DATE]
 F. Breathing:   Observed
Gestational Age

 Clinical EDD:  34w 3d                                        EDD:   03/08/14
 Best:          34w 3d     Det. By:  Clinical EDD             EDD:   03/08/14
Cervix Uterus Adnexa

 Cervix:       Not visualized (advanced GA >76wks)
 Uterus:       No abnormality visualized.
 Cul De Sac:   No free fluid seen.

 Left Ovary:    No adnexal mass visualized.
 Right Ovary:   No adnexal mass visualized.
 Adnexa:     No adnexal mass visualized.
Impression

 SIUP at 08w0d for remote interpretation of ultrasound only
 Cephalic presentation
 AFI is normal
 BPP [DATE]
 No previa
Recommendations

 Follow up ultrasounds as clinically indicated.
 Correlate BPP with clinical scenario

## 2016-12-05 ENCOUNTER — Encounter (HOSPITAL_COMMUNITY): Payer: Self-pay | Admitting: Obstetrics and Gynecology

## 2018-03-12 ENCOUNTER — Emergency Department (HOSPITAL_COMMUNITY): Payer: 59

## 2018-03-12 ENCOUNTER — Encounter (HOSPITAL_COMMUNITY): Payer: Self-pay | Admitting: Emergency Medicine

## 2018-03-12 ENCOUNTER — Emergency Department (HOSPITAL_COMMUNITY)
Admission: EM | Admit: 2018-03-12 | Discharge: 2018-03-12 | Disposition: A | Discharge status: Home / Self Care | Payer: 59 | Attending: Emergency Medicine | Admitting: Emergency Medicine

## 2018-03-12 DIAGNOSIS — R0789 Other chest pain: Secondary | ICD-10-CM | POA: Diagnosis present

## 2018-03-12 LAB — BASIC METABOLIC PANEL
ANION GAP: 9 (ref 5–15)
BUN: 12 mg/dL (ref 6–20)
CALCIUM: 9.2 mg/dL (ref 8.9–10.3)
CO2: 22 mmol/L (ref 22–32)
Chloride: 107 mmol/L (ref 98–111)
Creatinine, Ser: 0.81 mg/dL (ref 0.44–1.00)
GFR calc Af Amer: >60 mL/min (ref >60)
GFR calc non Af Amer: >60 mL/min (ref >60)
GLUCOSE: 128 mg/dL — AB (ref 70–99)
POTASSIUM: 4.2 mmol/L (ref 3.5–5.1)
SODIUM: 138 mmol/L (ref 135–145)

## 2018-03-12 LAB — CBC
HCT: 37.3 % (ref 36.0–46.0)
Hemoglobin: 11.9 g/dL — ABNORMAL LOW (ref 12.0–15.0)
MCH: 27.9 pg (ref 26.0–34.0)
MCHC: 31.9 g/dL (ref 30.0–36.0)
MCV: 87.4 fL (ref 80.0–100.0)
PLATELETS: 286 10*3/uL (ref 150–400)
RBC: 4.27 MIL/uL (ref 3.87–5.11)
RDW: 13.2 % (ref 11.5–15.5)
WBC: 10.1 10*3/uL (ref 4.0–10.5)
nRBC: 0 % (ref 0.0–0.2)

## 2018-03-12 LAB — I-STAT BETA HCG BLOOD, ED (MC, WL, AP ONLY): I-stat hCG, quantitative: <5 m[IU]/mL (ref <5)

## 2018-03-12 LAB — D-DIMER, QUANTITATIVE: D-Dimer, Quant: 1.12 ug/mL-FEU — ABNORMAL HIGH (ref 0.00–0.50)

## 2018-03-12 LAB — I-STAT TROPONIN, ED: TROPONIN I, POC: 0.01 ng/mL (ref 0.00–0.08)

## 2018-03-12 MED ORDER — IBUPROFEN 800 MG PO TABS: 800.0000 mg | ORAL_TABLET | Freq: Three times a day (TID) | ORAL | 0 refills | Status: DC

## 2018-03-12 MED ORDER — IOPAMIDOL (ISOVUE-370) INJECTION 76%
100.0000 mL | Freq: Once | INTRAVENOUS | Status: AC | PRN
  Administered 2018-03-12: 58 mL via INTRAVENOUS

## 2018-03-12 MED ORDER — HYDROCODONE-ACETAMINOPHEN 5-325 MG PO TABS: 2.0000 | ORAL_TABLET | ORAL | 0 refills | Status: DC | PRN

## 2018-03-12 NOTE — ED Triage Notes (Addendum)
Cp x 1 month that comes and goes worse today , no n/v, states hurts to take a deep breath and hurts to move that left arm , no injury

## 2018-03-12 NOTE — ED Notes (Signed)
Patient transported to CT 

## 2018-03-12 NOTE — Discharge Instructions (Signed)
Return if any problems. See your Physician for recheck if pain persist  

## 2018-03-12 NOTE — ED Notes (Signed)
Patient Alert and oriented to baseline. Stable and ambulatory to baseline. Patient verbalized understanding of the discharge instructions.  Patient belongings were taken by the patient.   

## 2018-03-13 NOTE — ED Provider Notes (Signed)
[facility] Provider Note   CSN: [id number] Arrival date & time: 03/12/18  1435     History   Chief Complaint Chief Complaint  Patient presents with  . Chest Pain    HPI Mary Whitehead is a 28 y.o. female.  The history is provided by the patient. No language interpreter was used.  Chest Pain  Pain location:  Substernal area Pain quality: aching   Pain radiates to:  Does not radiate Pain severity:  Moderate Onset quality:  Gradual Timing:  Constant Progression:  Worsening Chronicity:  New Context: breathing   Relieved by:  Nothing Worsened by:  Nothing Ineffective treatments:  None tried Associated symptoms: shortness of breath    Pt complains of pain in her mid chest.  Pt reports pain with breathing Past Medical History:  Diagnosis Date  . Anxiety   . Depression   . Headache(784.0) 08/02/2012   Migraines  . Obesity     Patient Active Problem List   Diagnosis Date Noted  . Failed trial of labor 03/04/2014  . S/P cesarean section 03/04/2014  . Status post repeat low transverse cesarean section 03/04/2014  . Threatened preterm labor   . Morbid obesity (HCC) 01/26/2014  . Positive GBS test 01/26/2014  . Pregnancy complicated by previous recurrent miscarriages 01/26/2014  . Adjustment disorder with mixed anxiety and depressed mood - dx'd at age 28, no meds 08/25/2013  . Migraine, unspecified, without mention of intractable migraine without mention of status migrainosus 08/25/2013  . 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 H/O: C-section in 2011 for FTP beyond 9 cm; desires VBAC 08/25/2013    Past Surgical History:  Procedure Laterality Date  . CESAREAN SECTION    . CESAREAN SECTION N/A 03/04/2014   Procedure: CESAREAN SECTION;  Surgeon: Dorien Chihuahuaara J. Richardson Doppole, MD;  Location: [facility];  Service: Obstetrics;  Laterality: N/A;  . WISDOM TOOTH EXTRACTION       OB History    Gravida    5   Para  2   Term  2   Preterm      AB  3   Living  1     SAB  3   TAB      Ectopic      Multiple  0   Live Births  1            Home Medications    Prior to Admission medications   Medication Sig Start Date End Date Taking? Authorizing Provider  etonogestrel (NEXPLANON) 68 MG IMPL implant 68 mg by Subdermal route See admin instructions. Every 3 years   Yes [provider]  hydrocortisone 2.5 % ointment Apply 1 application topically as needed (for any facial itching).    Yes [provider]  medroxyPROGESTERone (PROVERA) 10 MG tablet Take 10 mg by mouth daily as needed (AS DIRECTED "for spotting").    Yes [provider]  traZODone (DESYREL) 50 MG tablet Take 50 mg by mouth at bedtime as needed for sleep.  12/12/17  Yes [provider]  venlafaxine (EFFEXOR) 37.5 MG tablet Take 37.5 mg by mouth 2 (two) times daily. 12/12/17  Yes [provider]  HYDROcodone-acetaminophen (NORCO/VICODIN) 5-325 MG tablet Take 2 tablets by mouth every 4 (four) hours as needed. 03/12/18   Elson AreasSofia, Leslie K, PA-C  ibuprofen (ADVIL,MOTRIN) 800 MG tablet Take 1 tablet (800 mg total) by mouth 3 (three) times daily. 03/12/18   Keenan BachelorSofia,  Lonia Skinner, PA-C    Family History Family History  Problem Relation Age of Onset  . Diabetes Father   . Hypertension Father   . Cancer Father   . Cancer Maternal Grandmother   . Diabetes Paternal Grandmother   . Hypertension Paternal Grandmother     Social History Social History   Tobacco Use  . Smoking status: Never Smoker  . Smokeless tobacco: Never Used  Substance Use Topics  . Alcohol use: Yes    Comment: occ  . Drug use: No     Allergies   Patient has no known allergies.   Review of Systems Review of Systems  Respiratory: Positive for shortness of breath.   Cardiovascular: Positive for chest pain.  All other systems reviewed and are negative.    Physical Exam Updated Vital Signs BP 109/70  (BP Location: Right Arm)   Pulse 80   Temp 98.6 F (37 C)   Resp 16   SpO2 99%   Physical Exam Vitals signs and nursing note reviewed.  Constitutional:      Appearance: She is well-developed.  HENT:     Head: Normocephalic and atraumatic.  Eyes:     Pupils: Pupils are equal, round, and reactive to light.  Neck:     Musculoskeletal: Normal range of motion.  Cardiovascular:     Rate and Rhythm: Normal rate.     Heart sounds: Normal heart sounds.  Pulmonary:     Effort: Pulmonary effort is normal.     Breath sounds: Normal breath sounds.  Abdominal:     Palpations: Abdomen is soft.  Musculoskeletal: Normal range of motion.  Skin:    General: Skin is warm.  Neurological:     General: No focal deficit present.     Mental Status: She is alert.  Psychiatric:        Mood and Affect: Mood normal.      ED Treatments / Results  Labs (all labs ordered are listed, but only abnormal results are displayed) Labs Reviewed  BASIC METABOLIC PANEL - Abnormal; Notable for the following components:      Result Value   Glucose, Bld 128 (*)    All other components within normal limits  CBC - Abnormal; Notable for the following components:   Hemoglobin 11.9 (*)    All other components within normal limits  D-DIMER, QUANTITATIVE (NOT AT [facility]) - Abnormal; Notable for the following components:   D-Dimer, Quant 1.12 (*)    All other components within normal limits  I-STAT TROPONIN, ED  I-STAT BETA HCG BLOOD, ED ([facility], [facility], [facility] ONLY)    EKG EKG Interpretation  Date/Time:  Tuesday March 12 2018 14:49:55 EST Ventricular Rate:  98 PR Interval:  142 QRS Duration: 70 QT Interval:  342 QTC Calculation: 436 R Axis:   57 Text Interpretation:  Normal sinus rhythm Normal ECG Confirmed by Kristine Royal [phone]) on 03/12/2018 6:03:25 PM   Radiology Dg Chest 2 View  Result Date: 03/12/2018 CLINICAL DATA:  Chest pain EXAM: CHEST - 2 VIEW COMPARISON:  None. FINDINGS: Lungs are clear. Heart  size and pulmonary vascularity are normal. No adenopathy. No pneumothorax. No bone lesions. IMPRESSION: No edema or consolidation. Electronically Signed   By: Bretta Bang III M.D.   On: 03/12/2018 15:36   Ct Angio Chest Pe W And/or Wo Contrast  Result Date: 03/12/2018 CLINICAL DATA:  Intermittent left chest and arm pain for the past month. Elevated D-dimer. EXAM: CT ANGIOGRAPHY CHEST WITH CONTRAST TECHNIQUE: Multidetector  CT imaging of the chest was performed using the standard protocol during bolus administration of intravenous contrast. Multiplanar CT image reconstructions and MIPs were obtained to evaluate the vascular anatomy. CONTRAST:  58 cc ISOVUE-370 IOPAMIDOL (ISOVUE-370) INJECTION 76% COMPARISON:  Chest radiographs obtained earlier today. FINDINGS: Cardiovascular: Satisfactory opacification of the pulmonary arteries to the segmental level. No evidence of pulmonary embolism. Normal heart size. No pericardial effusion. Mediastinum/Nodes: No enlarged mediastinal, hilar, or axillary lymph nodes. Thyroid gland, trachea, and esophagus demonstrate no significant findings. Lungs/Pleura: Lungs are clear. No pleural effusion or pneumothorax. Upper Abdomen: Unremarkable. Musculoskeletal: Normal appearing bones. Review of the MIP images confirms the above findings. IMPRESSION: Normal examination. Electronically Signed   By: Beckie Salts M.D.   On: 03/12/2018 20:20    Procedures Procedures (including critical care time)  Medications Ordered in ED Medications  iopamidol (ISOVUE-370) 76 % injection 100 mL (58 mLs Intravenous Contrast Given 03/12/18 1954)     Initial Impression / Assessment and Plan / ED Course  I have reviewed the triage vital signs and the nursing notes.  Pertinent labs & imaging results that were available during my care of the patient were reviewed by me and considered in my medical decision making (see chart for details).     MDM  Pt counseled on results.   Pt given rx for  hydrocodone and ibuprofen  Final Clinical Impressions(s) / ED Diagnoses   Final diagnoses:  Chest wall pain    ED Discharge Orders         Ordered    ibuprofen (ADVIL,MOTRIN) 800 MG tablet  3 times daily     03/12/18 2046    HYDROcodone-acetaminophen (NORCO/VICODIN) 5-325 MG tablet  Every 4 hours PRN     03/12/18 2046        An After Visit Summary was printed and given to the patient.    Osie Cheeks 03/13/18 1222    Wynetta Fines, MD 03/13/18 [phone]

## 2020-02-06 ENCOUNTER — Other Ambulatory Visit: Payer: Self-pay | Admitting: Surgical Oncology

## 2020-02-06 DIAGNOSIS — K449 Diaphragmatic hernia without obstruction or gangrene: Secondary | ICD-10-CM

## 2020-02-13 ENCOUNTER — Ambulatory Visit
Admission: RE | Admit: 2020-02-13 | Discharge: 2020-02-13 | Disposition: A | Discharge status: Home / Self Care | Payer: 59 | Source: Ambulatory Visit | Attending: Surgical Oncology | Admitting: Surgical Oncology

## 2020-02-13 ENCOUNTER — Other Ambulatory Visit: Payer: Self-pay | Admitting: Surgical Oncology

## 2020-02-13 DIAGNOSIS — K449 Diaphragmatic hernia without obstruction or gangrene: Secondary | ICD-10-CM

## 2020-09-25 ENCOUNTER — Encounter (HOSPITAL_BASED_OUTPATIENT_CLINIC_OR_DEPARTMENT_OTHER): Payer: Self-pay | Admitting: Emergency Medicine

## 2020-09-25 ENCOUNTER — Emergency Department (HOSPITAL_BASED_OUTPATIENT_CLINIC_OR_DEPARTMENT_OTHER): Payer: No Typology Code available for payment source | Admitting: Radiology

## 2020-09-25 ENCOUNTER — Emergency Department (HOSPITAL_BASED_OUTPATIENT_CLINIC_OR_DEPARTMENT_OTHER)
Admission: EM | Admit: 2020-09-25 | Discharge: 2020-09-25 | Disposition: A | Discharge status: Home / Self Care | Payer: No Typology Code available for payment source | Attending: Emergency Medicine | Admitting: Emergency Medicine

## 2020-09-25 ENCOUNTER — Other Ambulatory Visit: Payer: Self-pay

## 2020-09-25 DIAGNOSIS — R079 Chest pain, unspecified: Secondary | ICD-10-CM | POA: Diagnosis not present

## 2020-09-25 DIAGNOSIS — W19XXXA Unspecified fall, initial encounter: Secondary | ICD-10-CM

## 2020-09-25 DIAGNOSIS — M25512 Pain in left shoulder: Secondary | ICD-10-CM | POA: Diagnosis not present

## 2020-09-25 DIAGNOSIS — W010XXA Fall on same level from slipping, tripping and stumbling without subsequent striking against object, initial encounter: Secondary | ICD-10-CM | POA: Insufficient documentation

## 2020-09-25 DIAGNOSIS — M62830 Muscle spasm of back: Secondary | ICD-10-CM | POA: Insufficient documentation

## 2020-09-25 DIAGNOSIS — M25561 Pain in right knee: Secondary | ICD-10-CM | POA: Diagnosis not present

## 2020-09-25 DIAGNOSIS — M545 Low back pain, unspecified: Secondary | ICD-10-CM | POA: Insufficient documentation

## 2020-09-25 DIAGNOSIS — M62838 Other muscle spasm: Secondary | ICD-10-CM

## 2020-09-25 LAB — BASIC METABOLIC PANEL
Anion gap: 11 (ref 5–15)
BUN: 12 mg/dL (ref 6–20)
CO2: 23 mmol/L (ref 22–32)
Calcium: 9.8 mg/dL (ref 8.9–10.3)
Chloride: 104 mmol/L (ref 98–111)
Creatinine, Ser: 0.76 mg/dL (ref 0.44–1.00)
GFR, Estimated: >60 mL/min (ref >60)
Glucose, Bld: 92 mg/dL (ref 70–99)
Potassium: 3.9 mmol/L (ref 3.5–5.1)
Sodium: 138 mmol/L (ref 135–145)

## 2020-09-25 LAB — CBC
HCT: 37.7 % (ref 36.0–46.0)
Hemoglobin: 12.6 g/dL (ref 12.0–15.0)
MCH: 28.3 pg (ref 26.0–34.0)
MCHC: 33.4 g/dL (ref 30.0–36.0)
MCV: 84.5 fL (ref 80.0–100.0)
Platelets: 289 10*3/uL (ref 150–400)
RBC: 4.46 MIL/uL (ref 3.87–5.11)
RDW: 13.2 % (ref 11.5–15.5)
WBC: 14 10*3/uL — ABNORMAL HIGH (ref 4.0–10.5)
nRBC: 0 % (ref 0.0–0.2)

## 2020-09-25 LAB — TROPONIN I (HIGH SENSITIVITY)
Troponin I (High Sensitivity): <2 ng/L (ref <18)
Troponin I (High Sensitivity): <2 ng/L (ref <18)

## 2020-09-25 MED ORDER — OXYCODONE-ACETAMINOPHEN 5-325 MG PO TABS
1.0000 | ORAL_TABLET | ORAL | Status: DC | PRN
  Administered 2020-09-25: 1 via ORAL
  Filled 2020-09-25: qty 1

## 2020-09-25 MED ORDER — CYCLOBENZAPRINE HCL 10 MG PO TABS: 10.0000 mg | ORAL_TABLET | Freq: Two times a day (BID) | ORAL | 0 refills | Status: DC | PRN

## 2020-09-25 NOTE — ED Provider Notes (Signed)
[facility] Provider [facility]   CSN: [id number] Arrival date & time: 09/25/20  1625     History Chief Complaint  Patient presents with   Back Pain    Mary MausMichaela A Whitehead is a 30 y.o. female.  The history is provided by the patient and medical records. No language interpreter was used.  Back Pain Location:  Lumbar spine Quality:  Aching Radiates to:  Does not radiate Pain severity:  Mild Chronicity:  New Context: falling   Associated symptoms: no abdominal pain, no chest pain, no dysuria, no fever and no headaches   Shoulder Pain Location:  Shoulder Shoulder location:  L shoulder Injury: yes   Mechanism of injury: fall   Associated symptoms: back pain   Associated symptoms: no fatigue, no fever and no neck pain       Past Medical History:  Diagnosis Date   Anxiety    Depression    Headache(784.0) 08/02/2012   Migraines   Obesity     Patient Active Problem List   Diagnosis Date Noted   Failed trial of labor 03/04/2014   S/P cesarean section 03/04/2014   Status post repeat low transverse cesarean section 03/04/2014   Threatened preterm labor    Morbid obesity (HCC) 01/26/2014   Positive GBS test 01/26/2014   Pregnancy complicated by previous recurrent miscarriages 01/26/2014   Adjustment disorder with mixed anxiety and depressed mood - dx'd at age 30, no meds 08/25/2013   Migraine, unspecified, without mention of intractable migraine without mention of status migrainosus 08/25/2013   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 Richardson Doppole, MD;  Location: [facility];  Service: Obstetrics;  Laterality: N/A;   WISDOM TOOTH EXTRACTION       OB History     Gravida  5   Para  2    Term  2   Preterm      AB  3   Living  1      SAB  3   IAB      Ectopic      Multiple  0   Live Births  1           Family History  Problem Relation Age of Onset   Diabetes Father    Hypertension Father    Cancer Father    Cancer Maternal Grandmother    Diabetes Paternal Grandmother    Hypertension Paternal Grandmother     Social History   Tobacco Use   Smoking status: Never   Smokeless tobacco: Never  Substance Use Topics   Alcohol use: Yes    Comment: occ   Drug use: No    Home Medications Prior to Admission medications   Medication Sig Start Date End Date Taking? Authorizing Provider  etonogestrel (NEXPLANON) 68 MG IMPL implant 68 mg by Subdermal route See admin instructions. Every 3 years    [provider]  HYDROcodone-acetaminophen (NORCO/VICODIN) 5-325 MG tablet Take 2 tablets by mouth every 4 (four) hours as needed. 03/12/18   Elson AreasSofia, Leslie K, PA-C  hydrocortisone 2.5 % ointment Apply 1 application topically as needed (for any facial itching).     [provider]  ibuprofen (ADVIL,MOTRIN) 800 MG tablet Take 1  tablet (800 mg total) by mouth 3 (three) times daily. 03/12/18   Elson Areas, PA-C  medroxyPROGESTERone (PROVERA) 10 MG tablet Take 10 mg by mouth daily as needed (AS DIRECTED "for spotting").     [provider]  traZODone (DESYREL) 50 MG tablet Take 50 mg by mouth at bedtime as needed for sleep.  12/12/17   [provider]  venlafaxine (EFFEXOR) 37.5 MG tablet Take 37.5 mg by mouth 2 (two) times daily. 12/12/17   [provider]    Allergies    Patient has no known allergies.  Review of Systems   Review of Systems  Constitutional:  Negative for chills, diaphoresis, fatigue and fever.  HENT:  Negative for congestion.   Eyes:  Negative for visual disturbance.  Respiratory:  Negative for cough, chest tightness, shortness of breath and wheezing.   Cardiovascular:  Negative for chest pain,  palpitations and leg swelling.  Gastrointestinal:  Negative for abdominal pain, constipation, diarrhea, nausea and vomiting.  Genitourinary:  Negative for dysuria and flank pain.  Musculoskeletal:  Positive for back pain. Negative for neck pain and neck stiffness.  Skin:  Negative for rash and wound.  Neurological:  Negative for dizziness, light-headedness and headaches.  Psychiatric/Behavioral:  Negative for agitation and confusion.   All other systems reviewed and are negative.  Physical Exam Updated Vital Signs BP 132/89 (BP Location: Right Arm)   Pulse 80   Temp 98.5 F (36.9 C) (Oral)   Resp 20   Ht 5\' 2"  (1.575 m)   Wt 113.4 kg   SpO2 100%   BMI 45.73 kg/m   Physical Exam Vitals and nursing note reviewed.  Constitutional:      General: She is not in acute distress.    Appearance: She is well-developed. She is not ill-appearing, toxic-appearing or diaphoretic.  HENT:     Head: Normocephalic and atraumatic.     Nose: No congestion.     Mouth/Throat:     Mouth: Mucous membranes are moist.     Pharynx: No oropharyngeal exudate.  Eyes:     Conjunctiva/sclera: Conjunctivae normal.     Pupils: Pupils are equal, round, and reactive to light.  Cardiovascular:     Rate and Rhythm: Normal rate and regular rhythm.     Heart sounds: No murmur heard. Pulmonary:     Effort: Pulmonary effort is normal. No respiratory distress.     Breath sounds: Normal breath sounds.  Abdominal:     Palpations: Abdomen is soft.     Tenderness: There is no abdominal tenderness. There is no right CVA tenderness, left CVA tenderness, guarding or rebound.  Musculoskeletal:        General: Tenderness present.     Left shoulder: Tenderness present. No swelling, deformity, effusion, laceration or crepitus. Normal range of motion. Normal strength.       Arms:     Cervical back: Neck supple. No tenderness.     Lumbar back: Spasms and tenderness present.       Back:     Right lower leg: No edema.      Left lower leg: No edema.       Legs:     Comments: Mild tenderness of the left shoulder, right knee, and bilateral paraspinal lumbar spine.  Normal pulses, strength, and sensation distally.  Skin:    General: Skin is warm and dry.     Capillary Refill: Capillary refill takes less than 2 seconds.     Findings: No erythema  or rash.  Neurological:     General: No focal deficit present.     Mental Status: She is alert.     Sensory: No sensory deficit.     Motor: No weakness.     ED Results / Procedures / Treatments   Labs (all labs ordered are listed, but only abnormal results are displayed) Labs Reviewed  CBC - Abnormal; Notable for the following components:      Result Value   WBC 14.0 (*)    All other components within normal limits  BASIC METABOLIC PANEL  PREGNANCY, URINE  TROPONIN I (HIGH SENSITIVITY)  TROPONIN I (HIGH SENSITIVITY)    EKG EKG Interpretation  Date/Time:  Saturday September 25 2020 18:16:46 EDT Ventricular Rate:  118 PR Interval:  136 QRS Duration: 72 QT Interval:  318 QTC Calculation: 445 R Axis:   26 Text Interpretation: Sinus tachycardia Possible Left atrial enlargement Borderline ECG When compared to prior, faster rate. No STEMI Confirmed by Theda Belfast ([id number]) on 09/25/2020 6:53:11 PM  Radiology DG Chest 2 View  Result Date: 09/25/2020 CLINICAL DATA:  Mechanical fall. EXAM: CHEST - 2 VIEW COMPARISON:  Chest radiograph 03/12/2018 FINDINGS: The heart size and mediastinal contours are within normal limits. The lungs are clear. No pneumothorax or pleural effusion. No acute finding in the visualized skeleton. IMPRESSION: No acute cardiopulmonary process. Electronically Signed   By: Emmaline Kluver M.D.   On: 09/25/2020 20:11    Procedures Procedures   Medications Ordered in ED Medications  oxyCODONE-acetaminophen (PERCOCET/ROXICET) 5-325 MG per tablet 1 tablet (1 tablet Oral Given 09/25/20 1825)    ED Course  I have reviewed the triage vital  signs and the nursing notes.  Pertinent labs & imaging results that were available during my care of the patient were reviewed by me and considered in my medical decision making (see chart for details).    MDM Rules/Calculators/A&P                           Mary Whitehead is a 30 y.o. female with a past medical history significant for migraines, anxiety, depression who presents with fall.  According to patient, she was carrying her young nephew when she had a mechanical fall tripping on a rug.  As she was going to the ground she shielded the baby and the nephew was fine but she ended up hurting her right knee, her low back, and her left shoulder.  She also had some transient chest discomfort that was brief and left.  Upon arrival, she is primarily complaining of the left shoulder pain but did have some chest pain she wanted evaluated.  On exam, chest is nontender lungs are clear.  Left shoulder is tender to palpation with normal sensation, grip strength, and pulses in extremities.  Right knee was slightly tender to palpation but normal range of motion, sensation, and strength distally.  No hip tenderness.  Lumbar spine was tender in the paraspinal areas with some spasm but otherwise no midline tenderness.  Abdomen nontender no focal neurologic deficits.  Patient had work-up including laboratory testing EKG and a chest x-ray which showed no acute cardiopulmonary abnormality.  EKG did not show STEMI.  Troponin was negative x2.  Had a shared decision made conversation with patient about getting an x-ray of her shoulder but she does not want to wait for this.  She suspects it is just spasm and pain from the fall and if she has  persistent symptoms she will return for a shoulder x-ray.  She does not want to wait longer as she has been in the emergency department for over 7 hours during her work-up today.  Her knee was not significant tender and she was able to ambulate without difficulty.  Suspect mild  musculoskeletal pain.  Suspect back spasm as well.  Given reassuring exam and work-up with no red flags, we feel it is reasonable let her go home.  She will follow-up with PCP and understood return precautions.  Patient discharged in good condition with prescription for muscle relaxant for the muscle spasm.       Final Clinical Impression(s) / ED Diagnoses Final diagnoses:  Fall, initial encounter  Acute pain of left shoulder  Acute pain of right knee  Chest pain, unspecified type  Muscle spasm    Rx / DC Orders ED Discharge Orders          Ordered    cyclobenzaprine (FLEXERIL) 10 MG tablet  2 times daily PRN        09/25/20 2315           Clinical Impression: 1. Fall, initial encounter   2. Acute pain of left shoulder   3. Acute pain of right knee   4. Chest pain, unspecified type   5. Muscle spasm     Disposition: Discharge  Condition: Good  I have discussed the results, Dx and Tx plan with the pt(& family if present). He/she/they expressed understanding and agree(s) with the plan. Discharge instructions discussed at great length. Strict return precautions discussed and pt &/or family have verbalized understanding of the instructions. No further questions at time of discharge.    Discharge Medication List as of 09/25/2020 11:16 PM     START taking these medications   Details  cyclobenzaprine (FLEXERIL) 10 MG tablet Take 1 tablet (10 mg total) by mouth 2 (two) times daily as needed for muscle spasms., Starting Sat 09/25/2020, Print        Follow Up: Augustine Radar, MD [address] [place] [address] [phone]     [facility] [address] [place] [address] [phone]       Harmonii Karle, Canary Brim, MD 09/26/20 [phone]

## 2020-09-25 NOTE — ED Triage Notes (Addendum)
Pt presents to ED POV. Pt c/o L armpit/side and lumbar back pain. Pt reports that she had mechanical fall this afternoon. L arm took a lot of impact. Pt reports she did not hit head, no LOC, and no blood thinners.

## 2020-09-25 NOTE — ED Notes (Addendum)
Pt c/o increased pain radiating more towards chest and L arm is tingling while in lobby. EKG done and brought to Dr. Rush Landmark. No cardiac hx. Labs to be drawn. Given pain meds

## 2020-09-25 NOTE — Discharge Instructions (Addendum)
Your history and exam today are consistent with soft tissue and musculoskeletal pains after the fall.  We did not see any evidence of rib fractures or a cardiac cause of your symptoms.  We had a shared decision-making conversation about getting an x-ray of the shoulder but as your symptoms had improved and your exam was overall reassuring, we agreed to hold on the images at this time.  Please use the muscle relaxant to help with the muscle spasm and pain and if any symptoms change or worsen, please return for likely x-ray of the shoulder.  Please follow-up with your primary doctor.

## 2022-01-23 ENCOUNTER — Encounter: Payer: No Typology Code available for payment source | Admitting: Obstetrics and Gynecology

## 2022-01-25 ENCOUNTER — Encounter: Payer: Self-pay | Admitting: Obstetrics & Gynecology

## 2022-01-25 ENCOUNTER — Ambulatory Visit (INDEPENDENT_AMBULATORY_CARE_PROVIDER_SITE_OTHER): Payer: No Typology Code available for payment source | Admitting: Obstetrics & Gynecology

## 2022-01-25 ENCOUNTER — Other Ambulatory Visit (HOSPITAL_COMMUNITY)
Admission: RE | Admit: 2022-01-25 | Discharge: 2022-01-25 | Disposition: A | Discharge status: Home / Self Care | Payer: No Typology Code available for payment source | Source: Ambulatory Visit | Attending: Obstetrics & Gynecology | Admitting: Obstetrics & Gynecology

## 2022-01-25 VITALS — BP 118/80 | HR 80 | Ht 62.0 in | Wt 205.0 lb

## 2022-01-25 DIAGNOSIS — Z01419 Encounter for gynecological examination (general) (routine) without abnormal findings: Secondary | ICD-10-CM | POA: Diagnosis present

## 2022-01-25 DIAGNOSIS — Z113 Encounter for screening for infections with a predominantly sexual mode of transmission: Secondary | ICD-10-CM | POA: Diagnosis present

## 2022-01-25 DIAGNOSIS — Z3046 Encounter for surveillance of implantable subdermal contraceptive: Secondary | ICD-10-CM

## 2022-01-25 NOTE — Progress Notes (Signed)
Mary Whitehead Dec 30, 1990 [id number]   History:    31 y.o. G5P2A3L2 Stable boyfriend.  Sons are 67 and 58 yo.  RP:  New patient presenting for annual gyn exam   HPI: Well on Nexplanon x 02/2019.  Menses regular normal.  No BTB.  No pelvic pain.  No pain with IC.  Last Pap test around 2016-2018.  No h/o abnormal Pap.  Pap/HPV HR today with Gono-Chlam.  Declines the rest of the STI work-up.  Breasts normal.  Mother with Breast Ca Dxed at age 90.  Genetic screening Negative.  BMI 37.49.  Had Bariatric surgery.  Health labs with Fam MD.  Past medical history,surgical history, family history and social history were all reviewed and documented in the EPIC chart.  Gynecologic History Patient's last menstrual period was 01/25/2022.  Obstetric History OB History  Gravida Para Term Preterm AB Living  5 2 2   3 2   SAB IAB Ectopic Multiple Live Births  3     0 1    # Outcome Date GA Lbr Len/2nd Weight Sex Delivery Anes PTL Lv  5 Term 03/04/14 [redacted]w[redacted]d  7 lb 11.6 oz (3.504 kg) M CS-LTranv EPI  LIV     Birth Comments: Hgb, Normal, FA Newborn Screen Barcode: [redacted]w[redacted]d Date Collected: 03/05/2014 Name on Newborn screen is spelled Jase Lochlear.   4 SAB           3 SAB           2 SAB           1 Term              ROS: A ROS was performed and pertinent positives and negatives are included in the history. GENERAL: No fevers or chills. HEENT: No change in vision, no earache, sore throat or sinus congestion. NECK: No pain or stiffness. CARDIOVASCULAR: No chest pain or pressure. No palpitations. PULMONARY: No shortness of breath, cough or wheeze. GASTROINTESTINAL: No abdominal pain, nausea, vomiting or diarrhea, melena or bright red blood per rectum. GENITOURINARY: No urinary frequency, urgency, hesitancy or dysuria. MUSCULOSKELETAL: No joint or muscle pain, no back pain, no recent trauma. DERMATOLOGIC: No rash, no itching, no lesions. ENDOCRINE: No polyuria, polydipsia, no heat or cold intolerance. No  recent change in weight. HEMATOLOGICAL: No anemia or easy bruising or bleeding. NEUROLOGIC: No headache, seizures, numbness, tingling or weakness. PSYCHIATRIC: No depression, no loss of interest in normal activity or change in sleep pattern.     Exam:   BP 118/80 (BP Location: Right Arm, Patient Position: Sitting, Cuff Size: Normal)   Pulse 80   Ht 5\' 2"  (1.575 m)   Wt 205 lb (93 kg)   LMP 01/25/2022   BMI 37.49 kg/m   Body mass index is 37.49 kg/m.  General appearance : Well developed well nourished female. No acute distress HEENT: Eyes: no retinal hemorrhage or exudates,  Neck supple, trachea midline, no carotid bruits, no thyroidmegaly Lungs: Clear to auscultation, no rhonchi or wheezes, or rib retractions  Heart: Regular rate and rhythm, no murmurs or gallops Breast:Examined in sitting and supine position were symmetrical in appearance, no palpable masses or tenderness,  no skin retraction, no nipple inversion, no nipple discharge, no skin discoloration, no axillary or supraclavicular lymphadenopathy Abdomen: no palpable masses or tenderness, no rebound or guarding Extremities: no edema or skin discoloration or tenderness  Pelvic: Vulva: Normal             Vagina: No gross lesions  or discharge  Cervix: No gross lesions or discharge.  Pap/HPV HR, Gono-Chlam done.  Uterus  AV, normal size, shape and consistency, non-tender and mobile  Adnexa  Without masses or tenderness  Anus: Normal   Assessment/Plan:  31 y.o. female for annual exam   1. Encounter for routine gynecological examination with Papanicolaou smear of cervix Well on Nexplanon x 02/2019.  Menses regular normal.  No BTB.  No pelvic pain.  No pain with IC.  Last Pap test around 2016-2018.  No h/o abnormal Pap.  Pap/HPV HR today with Gono-Chlam.  Declines the rest of the STI work-up.  Breasts normal.  Mother with Breast Ca Dxed at age 66.  Genetic screening Negative.  BMI 37.49.  Had Bariatric surgery.  Health labs with  Fam MD. - Cytology - PAP( [place])  2. Encounter for surveillance of Nexplanon subdermal contraceptive Well on Nexplanon x 02/2019.  F/U 02/2022 to change Nexplanon.  3. Screen for STD (sexually transmitted disease) Gono-Chlam on Pap with HR HPV.  Declines the rest of the STI screen. - Cytology - PAP( [place])  Other orders - Multiple Vitamin (MULTIVITAMIN) capsule; Take 1 capsule by mouth daily.   Genia Del MD, 8:28 AM

## 2022-01-26 ENCOUNTER — Other Ambulatory Visit: Payer: Self-pay | Admitting: *Deleted

## 2022-01-26 DIAGNOSIS — Z3046 Encounter for surveillance of implantable subdermal contraceptive: Secondary | ICD-10-CM

## 2022-01-26 DIAGNOSIS — Z30430 Encounter for insertion of intrauterine contraceptive device: Secondary | ICD-10-CM

## 2022-02-01 LAB — CYTOLOGY - PAP
Chlamydia: NEGATIVE
Comment: NEGATIVE
Comment: NEGATIVE
Comment: NORMAL
Diagnosis: NEGATIVE
High risk HPV: NEGATIVE
Neisseria Gonorrhea: NEGATIVE

## 2022-02-23 ENCOUNTER — Ambulatory Visit
Admission: EM | Admit: 2022-02-23 | Discharge: 2022-02-23 | Disposition: A | Discharge status: Home / Self Care | Payer: No Typology Code available for payment source | Attending: Internal Medicine | Admitting: Internal Medicine

## 2022-02-23 DIAGNOSIS — Z1152 Encounter for screening for COVID-19: Secondary | ICD-10-CM | POA: Insufficient documentation

## 2022-02-23 DIAGNOSIS — B349 Viral infection, unspecified: Secondary | ICD-10-CM | POA: Diagnosis not present

## 2022-02-23 DIAGNOSIS — R059 Cough, unspecified: Secondary | ICD-10-CM | POA: Diagnosis present

## 2022-02-23 MED ORDER — BENZONATATE 100 MG PO CAPS: 100.0000 mg | ORAL_CAPSULE | Freq: Three times a day (TID) | ORAL | 0 refills | Status: DC | PRN

## 2022-02-23 MED ORDER — ACETAMINOPHEN 325 MG PO TABS
650.0000 mg | ORAL_TABLET | Freq: Once | ORAL | Status: AC
  Administered 2022-02-23: 650 mg via ORAL

## 2022-02-23 MED ORDER — PROMETHAZINE-DM 6.25-15 MG/5ML PO SYRP: 5.0000 mL | ORAL_SOLUTION | Freq: Three times a day (TID) | ORAL | 0 refills | Status: DC | PRN

## 2022-02-23 MED ORDER — LEVOCETIRIZINE DIHYDROCHLORIDE 2.5 MG/5ML PO SOLN: 2.5000 mg | Freq: Every evening | ORAL | 12 refills | Status: DC

## 2022-02-23 MED ORDER — PSEUDOEPHEDRINE HCL 30 MG PO TABS: 30.0000 mg | ORAL_TABLET | Freq: Three times a day (TID) | ORAL | 0 refills | Status: DC | PRN

## 2022-02-23 NOTE — ED Provider Notes (Signed)
[facility]  Note:  This document was prepared using Conservation officer, historic buildings and may include unintentional dictation errors.  MRN: [id number] DOB: 1991/02/17  Subjective:   Mary Whitehead is a 31 y.o. female presenting for 4 day history of acute onset fever, sinus congestion, coughing, sinus headaches, body aches, chills. Has had shob with walking up stairs. No chest pain, wheezing, no history of asthma. No smoking.  No sick exposures that she knows of.  No current facility-administered medications for this encounter.  Current Outpatient Medications:    etonogestrel (NEXPLANON) 68 MG IMPL implant, 68 mg by Subdermal route See admin instructions. Every 3 years, Disp: , Rfl:    HYDROcodone-acetaminophen (NORCO/VICODIN) 5-325 MG tablet, Take 2 tablets by mouth every 4 (four) hours as needed., Disp: 10 tablet, Rfl: 0   ibuprofen (ADVIL,MOTRIN) 800 MG tablet, Take 1 tablet (800 mg total) by mouth 3 (three) times daily., Disp: 21 tablet, Rfl: 0   medroxyPROGESTERone (PROVERA) 10 MG tablet, Take 10 mg by mouth daily as needed (AS DIRECTED "for spotting"). , Disp: , Rfl:    Multiple Vitamin (MULTIVITAMIN) capsule, Take 1 capsule by mouth daily., Disp: , Rfl:    traZODone (DESYREL) 50 MG tablet, Take 50 mg by mouth at bedtime as needed for sleep.  (Patient not taking: Reported on 01/25/2022), Disp: , Rfl:    No Known Allergies  Past Medical History:  Diagnosis Date   Adenomyosis    Anxiety    Depression    Headache(784.0) 08/02/2012   Migraines   Obesity      Past Surgical History:  Procedure Laterality Date   CESAREAN SECTION     CESAREAN SECTION N/A 03/04/2014   Procedure: CESAREAN SECTION;  Surgeon: Dorien Chihuahua. Richardson Dopp, MD;  Location: [facility];  Service: Obstetrics;  Laterality: N/A;   SLEEVE GASTROPLASTY     WISDOM TOOTH EXTRACTION      Family History  Problem Relation Age of Onset   Diabetes Father    Hypertension Father    Cancer Father     Cancer Maternal Grandmother    Diabetes Paternal Grandmother    Hypertension Paternal Grandmother     Social History   Tobacco Use   Smoking status: Never   Smokeless tobacco: Never  Vaping Use   Vaping Use: Never used  Substance Use Topics   Alcohol use: Yes    Comment: occ   Drug use: No    ROS   Objective:   Vitals: BP 90/66 (BP Location: Right Arm)   Pulse (!) 112   Temp (!) 100.6 F (38.1 C) (Oral)   Resp 20   LMP 02/22/2022   SpO2 94%   Physical Exam Constitutional:      General: She is not in acute distress.    Appearance: Normal appearance. She is well-developed and normal weight. She is not ill-appearing, toxic-appearing or diaphoretic.  HENT:     Head: Normocephalic and atraumatic.     Right Ear: Tympanic membrane, ear canal and external ear normal. No drainage or tenderness. No middle ear effusion. There is no impacted cerumen. Tympanic membrane is not erythematous or bulging.     Left Ear: Tympanic membrane, ear canal and external ear normal. No drainage or tenderness.  No middle ear effusion. There is no impacted cerumen. Tympanic membrane is not erythematous or bulging.     Nose: Nose normal. No congestion or rhinorrhea.     Mouth/Throat:     Mouth: Mucous membranes are  moist. No oral lesions.     Pharynx: No pharyngeal swelling, oropharyngeal exudate, posterior oropharyngeal erythema or uvula swelling.     Tonsils: No tonsillar exudate or tonsillar abscesses.  Eyes:     General: No scleral icterus.       Right eye: No discharge.        Left eye: No discharge.     Extraocular Movements: Extraocular movements intact.     Right eye: Normal extraocular motion.     Left eye: Normal extraocular motion.     Conjunctiva/sclera: Conjunctivae normal.  Cardiovascular:     Rate and Rhythm: Normal rate and regular rhythm.     Heart sounds: Normal heart sounds. No murmur heard.    No friction rub. No gallop.  Pulmonary:     Effort: Pulmonary effort is  normal. No respiratory distress.     Breath sounds: No stridor. No wheezing, rhonchi or rales.  Chest:     Chest wall: No tenderness.  Musculoskeletal:     Cervical back: Normal range of motion and neck supple.  Lymphadenopathy:     Cervical: No cervical adenopathy.  Skin:    General: Skin is warm and dry.  Neurological:     General: No focal deficit present.     Mental Status: She is alert and oriented to person, place, and time.  Psychiatric:        Mood and Affect: Mood normal.        Behavior: Behavior normal.     Assessment and Plan :   PDMP not reviewed this encounter.  1. Acute viral syndrome     Deferred imaging given clear cardiopulmonary exam, hemodynamically stable vital signs.  No overt signs of bacterial infection, will hold off on antibiotic use for now. Will manage for viral illness such as viral URI, viral syndrome, viral rhinitis, COVID-19. Recommended supportive care. Offered scripts for symptomatic relief. Testing is pending. Counseled patient on potential for adverse effects with medications prescribed/recommended today, ER and return-to-clinic precautions discussed, patient verbalized understanding.   Patient should undergo Paxlovid should she test positive for this.   Wallis Bamberg, [place] 02/23/22 1015

## 2022-02-23 NOTE — ED Triage Notes (Signed)
Pt c/o cough, nasal congestion, HA, sore throat x 4 days-NAD-steady gait

## 2022-02-23 NOTE — Discharge Instructions (Signed)
We will notify you of your test results as they arrive and may take between about 24 hours.  I encourage you to sign up for MyChart if you have not already done so as this can be the easiest way for [place] to communicate results to you online or through a phone app.  Generally, we only contact you if it is a positive test result.  In the meantime, if you develop worsening symptoms including fever, chest pain, shortness of breath despite our current treatment plan then please report to the emergency room as this may be a sign of worsening status from possible viral infection.  Otherwise, we will manage this as a viral syndrome. For sore throat or cough try using a honey-based tea. Use 3 teaspoons of honey with juice squeezed from half lemon. Place shaved pieces of ginger into 1/2-1 cup of water and warm over stove top. Then mix the ingredients and repeat every 4 hours as needed. Please take Tylenol 500mg -650mg  every 6 hours for aches and pains, fevers. Hydrate very well with at least 2 liters of water. Eat light meals such as soups to replenish electrolytes and soft fruits, veggies. Start an antihistamine like Xyzal for postnasal drainage, sinus congestion.  You can take this together with pseudoephedrine (Sudafed) at a dose of 30 mg 2-3 times a day as needed for the same kind of congestion.  Use the cough medications as needed.

## 2022-02-24 LAB — SARS CORONAVIRUS 2 (TAT 6-24 HRS): SARS Coronavirus 2: NEGATIVE

## 2022-03-01 ENCOUNTER — Encounter: Payer: Self-pay | Admitting: Obstetrics & Gynecology

## 2022-03-01 ENCOUNTER — Ambulatory Visit (INDEPENDENT_AMBULATORY_CARE_PROVIDER_SITE_OTHER): Payer: No Typology Code available for payment source | Admitting: Obstetrics & Gynecology

## 2022-03-01 VITALS — BP 114/70 | HR 70 | Resp 16

## 2022-03-01 DIAGNOSIS — Z30430 Encounter for insertion of intrauterine contraceptive device: Secondary | ICD-10-CM

## 2022-03-01 DIAGNOSIS — Z01812 Encounter for preprocedural laboratory examination: Secondary | ICD-10-CM | POA: Diagnosis not present

## 2022-03-01 DIAGNOSIS — Z3046 Encounter for surveillance of implantable subdermal contraceptive: Secondary | ICD-10-CM | POA: Diagnosis not present

## 2022-03-01 DIAGNOSIS — T85628A Displacement of other specified internal prosthetic devices, implants and grafts, initial encounter: Secondary | ICD-10-CM | POA: Diagnosis not present

## 2022-03-01 NOTE — Progress Notes (Signed)
    Mary Whitehead 1990/03/13 [id number]        31 y.o.  [id number]   RP: Nexplanon removal after 3 years  HPI: Patient had Nexplanon inserted elsewhere about 3 years ago in 2020.  In the meantime, she had a Bariatric surgery and has lost significant weight.  Patient is not able to clearly feel the Nexplanon anymore.   OB History  Gravida Para Term Preterm AB Living  5 2 2   3 2   SAB IAB Ectopic Multiple Live Births  3     0 2    # Outcome Date GA Lbr Len/2nd Weight Sex Delivery Anes PTL Lv  5 Term 03/04/14 [redacted]w[redacted]d  7 lb 11.6 oz (3.504 kg) M CS-LTranv EPI  LIV     Birth Comments: Hgb, Normal, FA Newborn Screen Barcode: [id number] Date Collected: 03/05/2014 Name on Newborn screen is spelled Mary Whitehead.   4 SAB           3 SAB           2 SAB           1 Term             Past medical history,surgical history, problem list, medications, allergies, family history and social history were all reviewed and documented in the EPIC chart.   Directed ROS with pertinent positives and negatives documented in the history of present illness/assessment and plan.  Exam:  Vitals:   03/01/22 0952  BP: 114/70  Pulse: 70  Resp: 16   General appearance:  Normal  Nexplanon procedure note (removal)  The patient presented to the office today requesting for removal of her Nexplanon that was placed in the year 2020 on her left arm.   On examination the nexplanon implant was not clearly palpated.  Patient had a bariatric surgery, lost significant weight and her subcutaneous tissue feels very finely nodular.  Decision not to attempt Nexplanon removal today.  Will reschedule to proceed under US guidance.   Assessment/Plan:  32 y.o. [id number]   1. Removal of Nexplanon atient had Nexplanon inserted elsewhere about 3 years ago in 2020.  In the meantime, she had a Bariatric surgery and has lost significant weight.  Patient is not able to clearly feel the Nexplanon anymore. On examination the nexplanon  implant was not clearly palpated by myself.  Patient had a bariatric surgery, lost significant weight and her subcutaneous tissue feels very finely nodular.  Decision not to attempt Nexplanon removal today.  Will reschedule to proceed under US guidance.   2. Displacement of Nexplanon On examination the nexplanon implant was not clearly palpated by myself.  Schedule superficial US of the left arm to locate the Nexplanon and if possible remove it under US guidance.  If not possible, will refer patient to [facility] or [facility].  3. Encounter for preprocedural laboratory examination UPT Neg  Other orders - Acetaminophen (TYLENOL PO); Take by mouth as needed.   Princess Bruins MD, 10:04 AM 03/01/2022

## 2022-03-02 ENCOUNTER — Telehealth: Payer: Self-pay | Admitting: *Deleted

## 2022-03-02 DIAGNOSIS — Z3046 Encounter for surveillance of implantable subdermal contraceptive: Secondary | ICD-10-CM

## 2022-03-02 NOTE — Telephone Encounter (Signed)
Patient was seen in office on 03/01/22 for Nexplanon IUD removal. IUD displaced, unable to remove at that time, schedule with US guidance for removal.    Reviewed with Ivin Booty, sonographer. Scheduled for 04/13/22 at 11:30am, Dr. Dellis Filbert at 12pm. Order placed. Will need 60 min.   Spoke with patient, advised as seen above. Advised [place] first to determine location, Dr. Dellis Filbert will review [place] and results during this visit and determine if nexplanon can be removed in office or if referral needed. Patient verbalizes understanding and is agreeable.   Routing to provider for final review. Patient is agreeable to disposition. Will close encounter.  Business office notified.

## 2022-03-13 ENCOUNTER — Encounter: Payer: Self-pay | Admitting: Obstetrics & Gynecology

## 2022-03-13 ENCOUNTER — Ambulatory Visit (INDEPENDENT_AMBULATORY_CARE_PROVIDER_SITE_OTHER): Payer: No Typology Code available for payment source | Admitting: Obstetrics & Gynecology

## 2022-03-13 VITALS — BP 124/80

## 2022-03-13 DIAGNOSIS — A599 Trichomoniasis, unspecified: Secondary | ICD-10-CM | POA: Diagnosis not present

## 2022-03-13 DIAGNOSIS — N760 Acute vaginitis: Secondary | ICD-10-CM

## 2022-03-13 DIAGNOSIS — Z113 Encounter for screening for infections with a predominantly sexual mode of transmission: Secondary | ICD-10-CM | POA: Diagnosis not present

## 2022-03-13 DIAGNOSIS — N898 Other specified noninflammatory disorders of vagina: Secondary | ICD-10-CM

## 2022-03-13 LAB — WET PREP FOR TRICH, YEAST, CLUE

## 2022-03-13 MED ORDER — METRONIDAZOLE 500 MG PO TABS: 500.0000 mg | ORAL_TABLET | Freq: Two times a day (BID) | ORAL | 0 refills | Status: AC

## 2022-03-13 NOTE — Progress Notes (Signed)
    Mary Whitehead [birth year] [id number]        31 y.o.  [id number]   RP: Abundant vaginal discharge with odor and itching  HPI: Abundant vaginal discharge with odor and itching.  Tried OTC Antifungal cream, but no improvement.  No pelvic pain.  Last Gono-Chlam Neg 12/2021.  Not sexually active since then.  No vaginal bleeding currently.  LMBP 02/22/22. Nexplanon to be removed under US guidance, appointment scheduled.   OB History  Gravida Para Term Preterm AB Living  5 2 2   3 2   SAB IAB Ectopic Multiple Live Births  3     0 2    # Outcome Date GA Lbr Len/2nd Weight Sex Delivery Anes PTL Lv  5 Term 03/04/14 [redacted]w[redacted]d  7 lb 11.6 oz (3.504 kg) M CS-LTranv EPI  LIV     Birth Comments: Hgb, Normal, FA Newborn Screen Barcode: [id number] Date Collected: 03/05/2014 Name on Newborn screen is spelled Jase Lochlear.   4 SAB           3 SAB           2 SAB           1 Term             Past medical history,surgical history, problem list, medications, allergies, family history and social history were all reviewed and documented in the EPIC chart.   Directed ROS with pertinent positives and negatives documented in the history of present illness/assessment and plan.  Exam:  Vitals:   03/13/22 1551  BP: 124/80   General appearance:  Normal  Abdomen: Normal  Gynecologic exam: Vulva coated with a whitish fluid discharge.  Speculum:  Cervix/vagina normal.  Abundant discharge.  Wet prep done.  Gono-Chlam done with SureSwab.  Wet prep: Clue cells present with odor                  Trichomonas present   Assessment/Plan:  32 y.o. [id number]   1. Vaginal discharge Abundant vaginal discharge with odor and itching.  Tried OTC Antifungal cream, but no improvement.  No pelvic pain.  Last Gono-Chlam Neg 12/2021.  Not sexually active since then.  No vaginal bleeding currently.  LMP 02/22/22. Nexplanon to be removed under US guidance, appointment scheduled.  Abundant vaginal discharge on exam.  Wet  prep positive for both BV and Trichomonas.  Full STI screen done.  Will treat with Metronidazole 500 mg PO BID x 7 days, usage and side effects reviewed, prescription sent to pharmacy. - WET PREP FOR TRICH, YEAST, CLUE - SureSwab Advanced Vaginitis Plus,TMA  2. Screen for STD (sexually transmitted disease) Strict condom use strongly recommended. - HIV antibody (with reflex) - RPR - Hepatitis B Surface AntiGEN - Hepatitis C Antibody - SureSwab Advanced Vaginitis Plus,TMA   Other orders - metroNIDAZOLE (FLAGYL) 500 MG tablet; Take 1 tablet (500 mg total) by mouth 2 (two) times daily for 7 days.   Princess Bruins MD, 3:55 PM 03/13/2022

## 2022-03-14 LAB — RPR: RPR Ser Ql: NONREACTIVE

## 2022-03-14 LAB — SURESWAB® ADVANCED VAGINITIS PLUS,TMA
C. trachomatis RNA, TMA: NOT DETECTED
CANDIDA SPECIES: NOT DETECTED
Candida glabrata: NOT DETECTED
N. gonorrhoeae RNA, TMA: NOT DETECTED
SURESWAB(R) ADV BACTERIAL VAGINOSIS(BV),TMA: NEGATIVE
TRICHOMONAS VAGINALIS (TV),TMA: DETECTED — AB

## 2022-03-14 LAB — HEPATITIS C ANTIBODY: Hepatitis C Ab: NONREACTIVE

## 2022-03-14 LAB — HIV ANTIBODY (ROUTINE TESTING W REFLEX): HIV 1&2 Ab, 4th Generation: NONREACTIVE

## 2022-03-14 LAB — HEPATITIS B SURFACE ANTIGEN: Hepatitis B Surface Ag: NONREACTIVE

## 2022-04-12 ENCOUNTER — Encounter: Payer: Self-pay | Admitting: Obstetrics & Gynecology

## 2022-04-12 ENCOUNTER — Ambulatory Visit (INDEPENDENT_AMBULATORY_CARE_PROVIDER_SITE_OTHER): Payer: No Typology Code available for payment source | Admitting: Obstetrics & Gynecology

## 2022-04-12 VITALS — BP 118/74 | HR 86

## 2022-04-12 DIAGNOSIS — Z8619 Personal history of other infectious and parasitic diseases: Secondary | ICD-10-CM

## 2022-04-12 DIAGNOSIS — N760 Acute vaginitis: Secondary | ICD-10-CM

## 2022-04-12 DIAGNOSIS — B9689 Other specified bacterial agents as the cause of diseases classified elsewhere: Secondary | ICD-10-CM

## 2022-04-12 LAB — WET PREP FOR TRICH, YEAST, CLUE

## 2022-04-12 MED ORDER — FLUCONAZOLE 150 MG PO TABS: 150.0000 mg | ORAL_TABLET | Freq: Once | ORAL | 0 refills | Status: AC

## 2022-04-12 MED ORDER — TINIDAZOLE 500 MG PO TABS: 1000.0000 mg | ORAL_TABLET | Freq: Two times a day (BID) | ORAL | 0 refills | Status: DC

## 2022-04-12 NOTE — Progress Notes (Signed)
    Mary Whitehead [birth year] [id number]        32 y.o.  [id number]   RP: White vaginal discharge/TOC for positive Trichomonas 03/13/22  HPI: White vaginal discharge/TOC for positive Trichomonas 03/13/22.  Not sexually active x treatment.  No pelvic pain. No abnormal vaginal bleeding.  No fever.   OB History  Gravida Para Term Preterm AB Living  5 2 2   3 2   SAB IAB Ectopic Multiple Live Births  3     0 2    # Outcome Date GA Lbr Len/2nd Weight Sex Delivery Anes PTL Lv  5 Term 03/04/14 [redacted]w[redacted]d  7 lb 11.6 oz (3.504 kg) M CS-LTranv EPI  LIV     Birth Comments: Hgb, Normal, FA Newborn Screen Barcode: [id number] Date Collected: 03/05/2014 Name on Newborn screen is spelled Jase Lochlear.   4 SAB           3 SAB           2 SAB           1 Term             Past medical history,surgical history, problem list, medications, allergies, family history and social history were all reviewed and documented in the EPIC chart.   Directed ROS with pertinent positives and negatives documented in the history of present illness/assessment and plan.  Exam:  Vitals:   04/13/31 1009  BP: 118/74  Pulse: 86  SpO2: 97%   General appearance:  Normal  Abdomen: Normal  Gynecologic exam: Vulva normal.  Speculum:  Cervix/Vagina normal.  Normal white secretions.  Wet prep done.  Wet Prep:  Clue cells present with odor.  No trichomonas.   Assessment/Plan:  32 y.o. [id number]   1. H/O trichomonal vaginitis White vaginal discharge/TOC for positive Trichomonas 03/13/22.  Not sexually active x treatment.  No pelvic pain. No abnormal vaginal bleeding.  No fever. Trichomonas Neg.  Patient informed and reassured.  - WET PREP FOR TRICH, YEAST, CLUE  2. Bacterial vaginosis BV confirmed by wet prep.  Will treat with Tinidazole.  Fluconazole 150 mg x 1 tab post ABTx.  Other orders - tinidazole (TINDAMAX) 500 MG tablet; Take 2 tablets (1,000 mg total) by mouth 2 (two) times daily for 2 days. - fluconazole  (DIFLUCAN) 150 MG tablet; Take 1 tablet (150 mg total) by mouth once for 1 dose.   Princess Bruins MD, 10:41 AM 04/12/2022

## 2022-04-13 ENCOUNTER — Ambulatory Visit (INDEPENDENT_AMBULATORY_CARE_PROVIDER_SITE_OTHER): Payer: No Typology Code available for payment source | Admitting: Obstetrics & Gynecology

## 2022-04-13 ENCOUNTER — Encounter: Payer: Self-pay | Admitting: Obstetrics & Gynecology

## 2022-04-13 ENCOUNTER — Ambulatory Visit (INDEPENDENT_AMBULATORY_CARE_PROVIDER_SITE_OTHER): Payer: No Typology Code available for payment source

## 2022-04-13 VITALS — BP 114/70 | HR 90 | Resp 16

## 2022-04-13 DIAGNOSIS — Z3046 Encounter for surveillance of implantable subdermal contraceptive: Secondary | ICD-10-CM

## 2022-04-13 DIAGNOSIS — Z30430 Encounter for insertion of intrauterine contraceptive device: Secondary | ICD-10-CM

## 2022-04-13 NOTE — Progress Notes (Signed)
    Mary Whitehead 03/30/1990 [id number]        31 y.o.  [id number]   RP: Nexplanon localization by [place]  HPI:  Patient had Nexplanon inserted elsewhere >3 years ago in 2020.  In the meantime, she had a Bariatric surgery and has lost significant weight.  Patient is not able to clearly feel the Nexplanon anymore.  Not able to feel the Nexplanon with certainty on exam with me on 03/01/22.  Nodular adipose tissue adds to the difficulty.   OB History  Gravida Para Term Preterm AB Living  5 2 2   3 2  $ SAB IAB Ectopic Multiple Live Births  3     0 2    # Outcome Date GA Lbr Len/2nd Weight Sex Delivery Anes PTL Lv  5 Term 03/04/14 [redacted]w[redacted]d 7 lb 11.6 oz (3.504 kg) M CS-LTranv EPI  LIV     Birth Comments: Hgb, Normal, FA Newborn Screen Barcode: [id number] Collected: 03/05/2014 Name on Newborn screen is spelled Mary Whitehead.   4 SAB           3 SAB           2 SAB           1 Term             Past medical history,surgical history, problem list, medications, allergies, family history and social history were all reviewed and documented in the EPIC chart.   Directed ROS with pertinent positives and negatives documented in the history of present illness/assessment and plan.  Exam:  Vitals:   04/13/22 1133  BP: 114/70  Pulse: 90  Resp: 16   General appearance:  Normal  Left arm [place] Nexplanon could not be definitively located in the left arm.  Retrieval therefore not attempted.   Assessment/Plan:  32y.o. [id number]  1. Removal of Nexplanon  Patient had Nexplanon inserted elsewhere >3 years ago in 2020.  In the meantime, she had a Bariatric surgery and has lost significant weight.  Patient is not able to clearly feel the Nexplanon anymore.  Not able to feel the Nexplanon with certainty on exam with me on 03/01/22.  Nodular adipose tissue adds to the difficulty. Nexplanon could not be definitively located in the left arm by [place].  Retrieval therefore not attempted.  [place]  Patient voiced understanding and agreement with the plan.   MPrincess BruinsMD, 11:46 AM 04/13/2022

## 2022-04-17 ENCOUNTER — Encounter: Payer: Self-pay | Admitting: Obstetrics & Gynecology

## 2022-04-19 ENCOUNTER — Telehealth: Payer: Self-pay | Admitting: *Deleted

## 2022-04-19 ENCOUNTER — Encounter: Payer: Self-pay | Admitting: Obstetrics & Gynecology

## 2022-04-19 DIAGNOSIS — Z30430 Encounter for insertion of intrauterine contraceptive device: Secondary | ICD-10-CM

## 2022-04-19 DIAGNOSIS — Z3045 Encounter for surveillance of transdermal patch hormonal contraceptive device: Secondary | ICD-10-CM

## 2022-04-19 NOTE — Telephone Encounter (Signed)
Reviewed with Dr. Dellis Filbert.   Spoke with patient, reviewed options. Patient desires removal of nexplanon, requesting provider in [place].   Will refer to Dr. Gurney Maxin at [facility]. Advised patient their office will contact her directly to schedule. Patient appreciative of call.   Order placed for referral.   Routing to [place].

## 2022-04-19 NOTE — Telephone Encounter (Signed)
-----   Message from Princess Bruins, MD sent at 04/13/2022 11:50 AM EST ----- Regarding: Refer to [facility] or [facility] could not be definitively located in the left arm by [place].  Retrieval therefore not attempted.  Will refer to an Academic ([facility] or [place]) [place] for consultation.

## 2022-04-27 MED ORDER — NORELGESTROMIN-ETH ESTRADIOL 150-35 MCG/24HR TD PTWK: 1.0000 | MEDICATED_PATCH | TRANSDERMAL | 2 refills | Status: DC

## 2022-04-27 NOTE — Addendum Note (Signed)
Addended by: Judy Pimple D on: 04/27/2022 08:15 AM   Modules accepted: Orders

## 2022-04-30 ENCOUNTER — Other Ambulatory Visit: Payer: Self-pay | Admitting: Obstetrics & Gynecology

## 2022-05-01 NOTE — Telephone Encounter (Signed)
Office visit 04/12/22:  1. H/O trichomonal vaginitis White vaginal discharge/TOC for positive Trichomonas 03/13/22.  Not sexually active x treatment.  No pelvic pain. No abnormal vaginal bleeding.  No fever. Trichomonas Neg.  Patient informed and reassured.  - WET PREP FOR TRICH, YEAST, CLUE   2. Bacterial vaginosis BV confirmed by wet prep.  Will treat with Tinidazole.  Fluconazole 150 mg x 1 tab post ABTx.   Other orders - tinidazole (TINDAMAX) 500 MG tablet; Take 2 tablets (1,000 mg total) by mouth 2 (two) times daily for 2 days. - fluconazole (DIFLUCAN) 150 MG tablet; Take 1 tablet (150 mg total) by mouth once for 1 dose.    Princess Bruins MD, 10:41 AM 04/12/2022

## 2022-05-08 NOTE — Telephone Encounter (Signed)
Per review of EPIC, patient is scheduled for 05/17/22 at [facility] with Dr. Kieth Brightly.   Routing to Dr. Ileene Musa.   Encounter closed.

## 2022-05-17 ENCOUNTER — Ambulatory Visit
Admission: RE | Admit: 2022-05-17 | Discharge: 2022-05-17 | Disposition: A | Discharge status: Home / Self Care | Payer: No Typology Code available for payment source | Source: Ambulatory Visit | Attending: General Surgery

## 2022-05-17 ENCOUNTER — Other Ambulatory Visit: Payer: Self-pay | Admitting: General Surgery

## 2022-05-17 DIAGNOSIS — Z975 Presence of (intrauterine) contraceptive device: Secondary | ICD-10-CM

## 2022-07-10 ENCOUNTER — Ambulatory Visit
Admission: EM | Admit: 2022-07-10 | Discharge: 2022-07-10 | Disposition: A | Discharge status: Home / Self Care | Payer: No Typology Code available for payment source

## 2022-07-10 DIAGNOSIS — J029 Acute pharyngitis, unspecified: Secondary | ICD-10-CM | POA: Diagnosis present

## 2022-07-10 DIAGNOSIS — Z1152 Encounter for screening for COVID-19: Secondary | ICD-10-CM | POA: Diagnosis not present

## 2022-07-10 DIAGNOSIS — J069 Acute upper respiratory infection, unspecified: Secondary | ICD-10-CM | POA: Insufficient documentation

## 2022-07-10 DIAGNOSIS — B9789 Other viral agents as the cause of diseases classified elsewhere: Secondary | ICD-10-CM | POA: Insufficient documentation

## 2022-07-10 LAB — POCT RAPID STREP A (OFFICE): Rapid Strep A Screen: NEGATIVE

## 2022-07-10 MED ORDER — PROMETHAZINE-DM 6.25-15 MG/5ML PO SYRP
5.0000 mL | ORAL_SOLUTION | Freq: Four times a day (QID) | ORAL | 0 refills | Status: DC | PRN
Start: 2022-07-10 — End: 2022-11-16

## 2022-07-10 NOTE — Discharge Instructions (Signed)
Your strep test was negative in the clinic.  I will send this for culture and the clinic will contact you if it is positive.  The clinical also contact you with results of your COVID test is positive Promethazine DM as needed for cough.  Please note this medication make you drowsy.  Do not drink alcohol or drive while on this medication Rest and fluids Salt water gargles and warm liquids Please follow-up with your PCP if your symptoms do not improve Please go to the emergency room if you develop any worsening symptoms

## 2022-07-10 NOTE — ED Provider Notes (Signed)
[facility]    CSN: [id number] Arrival date & time: 07/10/22  0806      History   Chief Complaint Chief Complaint  Patient presents with   Sore Throat   Cough    HPI Mary Whitehead is a 32 y.o. female  presents for evaluation of URI symptoms for 4 days. Patient reports associated symptoms of sore throat x 4 days with cough and headache that began today. Denies N/V/D, fever, ear pain, body aches, shortness of breath. Patient does not have a hx of asthma or smoking.  Son was treated for strep throat last week and currently has URI symptoms.  Pt has taken cough medicine OTC for symptoms. Pt has no other concerns at this time.    Sore Throat Associated symptoms include headaches.  Cough Associated symptoms: headaches and sore throat     Past Medical History:  Diagnosis Date   Adenomyosis    Anxiety    Depression    Headache(784.0) 08/02/2012   Migraines   Obesity    STD (sexually transmitted disease)    trichomonas 03-13-22 treated    Patient Active Problem List   Diagnosis Date Noted   Failed trial of labor 03/04/2014   S/P cesarean section 03/04/2014   Status post repeat low transverse cesarean section 03/04/2014   Threatened preterm labor    Morbid obesity (HCC) 01/26/2014   Positive GBS test 01/26/2014   Pregnancy complicated by previous recurrent miscarriages 01/26/2014   Adjustment disorder with mixed anxiety and depressed mood - dx'd at age 29, no meds 08/25/2013   Migraine, unspecified, without mention of intractable migraine without mention of status migrainosus 08/25/2013   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 Richardson Dopp, MD;  Location: [facility];  Service: Obstetrics;   Laterality: N/A;   SLEEVE GASTROPLASTY     WISDOM TOOTH EXTRACTION      OB History     Gravida  5   Para  2   Term  2   Preterm      AB  3   Living  2      SAB  3   IAB      Ectopic      Multiple  0   Live Births  2            Home Medications    Prior to Admission medications   Medication Sig Start Date End Date Taking? Authorizing Provider  Nutritional Supplements (COLD AND FLU PO) Take by mouth.   Yes [provider]  promethazine-dextromethorphan (PROMETHAZINE-DM) 6.25-15 MG/5ML syrup Take 5 mLs by mouth 4 (four) times daily as needed for cough. 07/10/22  Yes Radford Pax, NP  Acetaminophen (TYLENOL PO) Take by mouth as needed.    [provider]  etonogestrel (NEXPLANON) 68 MG IMPL implant 68 mg by Subdermal route See admin instructions. Every 3 years    [provider]  levocetirizine (XYZAL) 2.5 MG/5ML solution Take 5 mLs (2.5 mg total) by mouth every evening. 02/23/22   Wallis Bamberg, PA-C  Multiple Vitamin (MULTIVITAMIN) capsule Take 1 capsule by mouth daily.    [provider]  norelgestromin-ethinyl estradiol [employer]) 150-35 MCG/24HR transdermal patch Place 1 patch  onto the skin once a week. 04/27/22   Genia Del, MD    Family History Family History  Problem Relation Age of Onset   Diabetes Father    Hypertension Father    Cancer Father    Cancer Maternal Grandmother    Diabetes Paternal Grandmother    Hypertension Paternal Grandmother     Social History Social History   Tobacco Use   Smoking status: Never   Smokeless tobacco: Never  Vaping Use   Vaping Use: Never used  Substance Use Topics   Alcohol use: Yes    Comment: occ   Drug use: No     Allergies   Patient has no known allergies.   Review of Systems Review of Systems  HENT:  Positive for sore throat.   Respiratory:  Positive for cough.   Neurological:  Positive for headaches.     Physical Exam Triage Vital Signs ED  Triage Vitals  Enc Vitals Group     BP 07/10/22 0816 114/81     Pulse Rate 07/10/22 0816 80     Resp 07/10/22 0816 16     Temp 07/10/22 0816 99.1 F (37.3 C)     Temp Source 07/10/22 0816 Oral     SpO2 07/10/22 0816 98 %     Weight --      Height --      Head Circumference --      Peak Flow --      Pain Score 07/10/22 0825 8     Pain Loc --      Pain Edu? --      Excl. in GC? --    No data found.  Updated Vital Signs BP 114/81 (BP Location: Right Arm)   Pulse 80   Temp 99.1 F (37.3 C) (Oral)   Resp 16   SpO2 98%   Visual Acuity Right Eye Distance:   Left Eye Distance:   Bilateral Distance:    Right Eye Near:   Left Eye Near:    Bilateral Near:     Physical Exam Vitals and nursing note reviewed.  Constitutional:      General: She is not in acute distress.    Appearance: She is well-developed. She is not ill-appearing.  HENT:     Head: Normocephalic and atraumatic.     Right Ear: Tympanic membrane and ear canal normal.     Left Ear: Tympanic membrane and ear canal normal.     Nose: Congestion present.     Mouth/Throat:     Mouth: Mucous membranes are moist.     Pharynx: Oropharynx is clear. Uvula midline. Posterior oropharyngeal erythema present.     Tonsils: No tonsillar exudate or tonsillar abscesses.  Eyes:     Conjunctiva/sclera: Conjunctivae normal.     Pupils: Pupils are equal, round, and reactive to light.  Cardiovascular:     Rate and Rhythm: Normal rate and regular rhythm.     Heart sounds: Normal heart sounds.  Pulmonary:     Effort: Pulmonary effort is normal.     Breath sounds: Normal breath sounds.  Musculoskeletal:     Cervical back: Normal range of motion and neck supple.  Lymphadenopathy:     Cervical: No cervical adenopathy.  Skin:    General: Skin is warm and dry.  Neurological:     General: No focal deficit present.     Mental Status: She is alert and oriented to person, place, and time.  Psychiatric:  Mood and Affect: Mood  normal.        Behavior: Behavior normal.      UC Treatments / Results  Labs (all labs ordered are listed, but only abnormal results are displayed) Labs Reviewed  SARS CORONAVIRUS 2 (TAT 6-24 HRS)  CULTURE, GROUP A STREP [facility])  POCT RAPID STREP A (OFFICE)    EKG   Radiology No results found.  Procedures Procedures (including critical care time)  Medications Ordered in UC Medications - No data to display  Initial Impression / Assessment and Plan / UC Course  I have reviewed the triage vital signs and the nursing notes.  Pertinent labs & imaging results that were available during my care of the patient were reviewed by me and considered in my medical decision making (see chart for details).     Negative rapid strep, will culture. COVID PCR and will contact if positive Discussed viral illness and symptomatic treatment Promethazine DM as needed for cough.  Side effect profile reviewed Salt water gargles warm liquids and rest and fluids PCP follow-up if symptoms do not improve ER precautions reviewed and patient verbalized understanding Final Clinical Impressions(s) / UC Diagnoses   Final diagnoses:  Sore throat  Viral upper respiratory illness     Discharge Instructions      Your strep test was negative in the clinic.  I will send this for culture and the clinic will contact you if it is positive.  The clinical also contact you with results of your COVID test is positive Promethazine DM as needed for cough.  Please note this medication make you drowsy.  Do not drink alcohol or drive while on this medication Rest and fluids Salt water gargles and warm liquids Please follow-up with your PCP if your symptoms do not improve Please go to the emergency room if you develop any worsening symptoms     ED Prescriptions     Medication Sig Dispense Auth. Provider   promethazine-dextromethorphan (PROMETHAZINE-DM) 6.25-15 MG/5ML syrup Take 5 mLs by mouth 4 (four) times  daily as needed for cough. 118 mL Radford Pax, NP      PDMP not reviewed this encounter.   Radford Pax, NP 07/10/22 [phone]

## 2022-07-10 NOTE — ED Triage Notes (Signed)
Pt reports sore throat x 4 days; cough and headache x 1 day. OTC cold and flu gives no relief.

## 2022-07-11 LAB — SARS CORONAVIRUS 2 (TAT 6-24 HRS): SARS Coronavirus 2: NEGATIVE

## 2022-07-11 LAB — CULTURE, GROUP A STREP (THRC)

## 2022-07-12 LAB — CULTURE, GROUP A STREP (THRC)

## 2022-07-13 LAB — CULTURE, GROUP A STREP (THRC)

## 2022-11-16 ENCOUNTER — Ambulatory Visit (INDEPENDENT_AMBULATORY_CARE_PROVIDER_SITE_OTHER): Payer: No Typology Code available for payment source | Admitting: Radiology

## 2022-11-16 VITALS — BP 98/66

## 2022-11-16 DIAGNOSIS — Z8619 Personal history of other infectious and parasitic diseases: Secondary | ICD-10-CM | POA: Diagnosis not present

## 2022-11-16 DIAGNOSIS — N760 Acute vaginitis: Secondary | ICD-10-CM

## 2022-11-16 DIAGNOSIS — Z113 Encounter for screening for infections with a predominantly sexual mode of transmission: Secondary | ICD-10-CM | POA: Diagnosis not present

## 2022-11-16 DIAGNOSIS — N898 Other specified noninflammatory disorders of vagina: Secondary | ICD-10-CM

## 2022-11-16 LAB — WET PREP FOR TRICH, YEAST, CLUE

## 2022-11-16 MED ORDER — METRONIDAZOLE 0.75 % VA GEL
1.0000 | Freq: Every day | VAGINAL | 0 refills | Status: AC
Start: 2022-11-16 — End: 2022-11-21

## 2022-11-16 NOTE — Progress Notes (Signed)
      Subjective: Mary Whitehead is a 32 y.o. female who complains of vaginal discharge, slight odor, no itching. Symptoms began last week. No new partners, would like STI screen, hx of trich.   Uses patch for BC. Has expired nexplanon in place, has seen surgeon at [facility] for consult regarding removal already.  Review of Systems  All other systems reviewed and are negative.   Past Medical History:  Diagnosis Date   Adenomyosis    Anxiety    Depression    Headache(784.0) 08/02/2012   Migraines   Obesity    STD (sexually transmitted disease)    trichomonas 03-13-22 treated      Objective:  Today's Vitals   11/16/22 1102  BP: 98/66   There is no height or weight on file to calculate BMI.   -General: no acute distress -Vulva: without lesions or discharge -Vagina: discharge present, aptima swab and wet prep obtained -Cervix: no lesion or discharge, no CMT -Perineum: no lesions -Uterus: Mobile, non tender -Adnexa: no masses or tenderness   Microscopic wet-mount exam shows clue cells.   Raynelle Fanning, CMA present for exam  Assessment:/Plan:   1. Vaginal discharge - WET PREP FOR TRICH, YEAST, CLUE +BV, rx sent for metrogel  2. H/O trichomonal vaginitis - SURESWAB CT/NG/T. vaginalis  3. Screen for STD (sexually transmitted disease) - SURESWAB CT/NG/T. vaginalis    Will contact patient with results of testing completed today. Avoid intercourse until symptoms are resolved. Safe sex encouraged. Avoid the use of soaps or perfumed products in the peri area. Avoid tub baths and sitting in sweaty or wet clothing for prolonged periods of time.   Angelos Wasco B, NP 11:13 AM

## 2022-11-17 LAB — SURESWAB CT/NG/T. VAGINALIS
C. trachomatis RNA, TMA: NOT DETECTED
N. gonorrhoeae RNA, TMA: NOT DETECTED
Trichomonas vaginalis RNA: NOT DETECTED

## 2023-01-01 ENCOUNTER — Other Ambulatory Visit: Payer: Self-pay | Admitting: Obstetrics & Gynecology

## 2023-01-01 DIAGNOSIS — Z3045 Encounter for surveillance of transdermal patch hormonal contraceptive device: Secondary | ICD-10-CM

## 2023-01-02 NOTE — Telephone Encounter (Signed)
Med refill request: Xulane Last AEX: 01/25/22 Next AEX: not scheduled Last MMG (if hormonal med) n/a Refill authorized: Last Rx sent #9 patch with 2 refills on 04/27/22. Please approve or deny as appropriate.

## 2023-01-09 ENCOUNTER — Emergency Department (HOSPITAL_BASED_OUTPATIENT_CLINIC_OR_DEPARTMENT_OTHER)
Admission: EM | Admit: 2023-01-09 | Discharge: 2023-01-10 | Disposition: A | Discharge status: Home / Self Care | Payer: No Typology Code available for payment source | Attending: Emergency Medicine | Admitting: Emergency Medicine

## 2023-01-09 ENCOUNTER — Encounter (HOSPITAL_BASED_OUTPATIENT_CLINIC_OR_DEPARTMENT_OTHER): Payer: Self-pay | Admitting: Emergency Medicine

## 2023-01-09 ENCOUNTER — Other Ambulatory Visit: Payer: Self-pay

## 2023-01-09 ENCOUNTER — Emergency Department (HOSPITAL_BASED_OUTPATIENT_CLINIC_OR_DEPARTMENT_OTHER): Payer: No Typology Code available for payment source

## 2023-01-09 ENCOUNTER — Ambulatory Visit
Admission: EM | Admit: 2023-01-09 | Discharge: 2023-01-09 | Payer: No Typology Code available for payment source | Attending: Internal Medicine | Admitting: Internal Medicine

## 2023-01-09 DIAGNOSIS — R197 Diarrhea, unspecified: Secondary | ICD-10-CM

## 2023-01-09 DIAGNOSIS — R1032 Left lower quadrant pain: Secondary | ICD-10-CM

## 2023-01-09 LAB — COMPREHENSIVE METABOLIC PANEL
ALT: 19 U/L (ref 0–44)
AST: 20 U/L (ref 15–41)
Albumin: 4.5 g/dL (ref 3.5–5.0)
Alkaline Phosphatase: 53 U/L (ref 38–126)
Anion gap: 7 (ref 5–15)
BUN: 10 mg/dL (ref 6–20)
CO2: 23 mmol/L (ref 22–32)
Calcium: 9.3 mg/dL (ref 8.9–10.3)
Chloride: 107 mmol/L (ref 98–111)
Creatinine, Ser: 0.71 mg/dL (ref 0.44–1.00)
GFR, Estimated: >60 mL/min (ref >60)
Glucose, Bld: 90 mg/dL (ref 70–99)
Potassium: 3.6 mmol/L (ref 3.5–5.1)
Sodium: 137 mmol/L (ref 135–145)
Total Bilirubin: 0.5 mg/dL (ref <1.2)
Total Protein: 7.6 g/dL (ref 6.5–8.1)

## 2023-01-09 LAB — CBC
HCT: 35.6 % — ABNORMAL LOW (ref 36.0–46.0)
Hemoglobin: 12 g/dL (ref 12.0–15.0)
MCH: 29 pg (ref 26.0–34.0)
MCHC: 33.7 g/dL (ref 30.0–36.0)
MCV: 86 fL (ref 80.0–100.0)
Platelets: 261 10*3/uL (ref 150–400)
RBC: 4.14 MIL/uL (ref 3.87–5.11)
RDW: 13.4 % (ref 11.5–15.5)
WBC: 9.2 10*3/uL (ref 4.0–10.5)
nRBC: 0 % (ref 0.0–0.2)

## 2023-01-09 LAB — LIPASE, BLOOD: Lipase: 53 U/L — ABNORMAL HIGH (ref 11–51)

## 2023-01-09 LAB — HCG, SERUM, QUALITATIVE: Preg, Serum: NEGATIVE

## 2023-01-09 MED ORDER — SODIUM CHLORIDE 0.9 % IV BOLUS
1000.0000 mL | Freq: Once | INTRAVENOUS | Status: AC
  Administered 2023-01-09: 1000 mL via INTRAVENOUS

## 2023-01-09 MED ORDER — IOHEXOL 300 MG/ML  SOLN
100.0000 mL | Freq: Once | INTRAMUSCULAR | Status: AC | PRN
  Administered 2023-01-09: 100 mL via INTRAVENOUS

## 2023-01-09 NOTE — ED Provider Notes (Signed)
[place] EMERGENCY DEPARTMENT AT [facility] Provider Note   CSN: [id number] Arrival date & time: 01/09/23  1615     History  Chief Complaint  Patient presents with   Diarrhea    Mary Whitehead is a 32 y.o. female.   Diarrhea Patient presents with diarrhea and left lower quadrant pain.  Has had diarrhea times bloody.  Left lower quadrant pain.  Had been remotely diagnosed with diverticulitis at an urgent care.  States they diagnosed it on the rectal exam.  Had been on antibiotics at time.  However recently developed diarrhea.  Has pain in the left side.  Decreased oral intake.  No fevers.  Pain does get bad at times.  No vaginal bleeding or discharge.     Past Surgical History:  Procedure Laterality Date   CESAREAN SECTION     CESAREAN SECTION N/A 03/04/2014   Procedure: CESAREAN SECTION;  Surgeon: Dorien Chihuahua. Richardson Dopp, MD;  Location: [facility];  Service: Obstetrics;  Laterality: N/A;   SLEEVE GASTROPLASTY     WISDOM TOOTH EXTRACTION      Home Medications Prior to Admission medications   Medication Sig Start Date End Date Taking? Authorizing Provider  Acetaminophen (TYLENOL PO) Take by mouth as needed.    [provider]  etonogestrel (NEXPLANON) 68 MG IMPL implant 68 mg by Subdermal route See admin instructions. Every 3 years    [provider]  levocetirizine (XYZAL) 2.5 MG/5ML solution Take 5 mLs (2.5 mg total) by mouth every evening. 02/23/22   Wallis Bamberg, PA-C  Multiple Vitamin (MULTIVITAMIN) capsule Take 1 capsule by mouth daily.    [provider]  norelgestromin-ethinyl estradiol [employer]) 150-35 MCG/24HR transdermal patch APPLY 1 PATCH ONCE A WEEK 01/02/23   Chrzanowski, Clearnce Hasten B, NP      Allergies    Patient has no known allergies.    Review of Systems   Review of Systems  Gastrointestinal:  Positive for diarrhea.    Physical Exam Updated Vital Signs BP 100/78 (BP Location: Right Arm)   Pulse 73   Temp 98.1 F (36.7 C) (Oral)    Resp 16   Wt 88.5 kg   LMP 12/18/2022 (Exact Date) Comment: having periods  SpO2 100%   BMI 35.67 kg/m  Physical Exam Vitals and nursing note reviewed.  Cardiovascular:     Rate and Rhythm: Regular rhythm.  Abdominal:     Tenderness: There is abdominal tenderness.     Comments: Left lower quadrant and left mid abdomen tenderness.  No rebound or guarding.  No hernia palpated.  Musculoskeletal:        General: No tenderness.     Cervical back: Neck supple.  Neurological:     Mental Status: She is alert.     ED Results / Procedures / Treatments   Labs (all labs ordered are listed, but only abnormal results are displayed) Labs Reviewed  LIPASE, BLOOD - Abnormal; Notable for the following components:      Result Value   Lipase 53 (*)    All other components within normal limits  CBC - Abnormal; Notable for the following components:   HCT 35.6 (*)    All other components within normal limits  COMPREHENSIVE METABOLIC PANEL  HCG, SERUM, QUALITATIVE    EKG None  Radiology No results found.  Procedures Procedures    Medications Ordered in ED Medications  sodium chloride 0.9 % bolus 1,000 mL (1,000 mLs Intravenous New Bag/Given 01/09/23 2141)  iohexol (OMNIPAQUE) 300 MG/ML  solution 100 mL (100 mLs Intravenous Contrast Given 01/09/23 2155)    ED Course/ Medical Decision Making/ A&P                                 Medical Decision Making Amount and/or Complexity of Data Reviewed Labs: ordered. Radiology: ordered.  Risk Prescription drug management.   Patient abdominal pain.  Left side abdomen.  Differential diagnoses long but includes causes such as colitis, diverticulitis, pancreatitis.  Lipase is just barely above normal.  Normal white count.  Reviewed urgent care note.  Will get CT scan to further evaluate.   CT scan pending.  Care turned over to Dr.Delo         Final Clinical Impression(s) / ED Diagnoses Final diagnoses:  Left lower quadrant  abdominal pain    Rx / DC Orders ED Discharge Orders     None         Benjiman Core, MD 01/09/23 2324

## 2023-01-09 NOTE — Discharge Instructions (Signed)
Please go to the ER for further evaluation of your symptoms  

## 2023-01-09 NOTE — ED Provider Notes (Signed)
[facility]    CSN: [id number] Arrival date & time: 01/09/23  1227      History   Chief Complaint No chief complaint on file.   HPI Mary Whitehead is a 32 y.o. female presents for abdominal pain and diarrhea.  Patient reports 2 weeks of intermittent left lower quadrant abdominal pain with diarrhea.  States it seemed to improved and then worsened again over the past few days.  Reports all liquid diarrhea with some blood.  Endorses worsening sharp left lower abdominal pain as well as left upper abdominal pain.  Does report subjective fevers.  Some nausea without vomiting.  Reports has no appetite and has difficulty staying hydrated.  Does not think she peed more than 2 or 3 times yesterday.  Did have some Pedialyte today.  No URI symptoms.  Has a history of gastric bypass.  States she was treated for diverticulitis in urgent care a couple years ago but has never had any CT confirming this and states only a rectal exam was performed.  No recent travel or change in diet/medications.  No dysuria.  No other concerns at this time.  HPI  Past Medical History:  Diagnosis Date   Adenomyosis    Anxiety    Depression    Headache(784.0) 08/02/2012   Migraines   Obesity    STD (sexually transmitted disease)    trichomonas 03-13-22 treated    Patient Active Problem List   Diagnosis Date Noted   Failed trial of labor 03/04/2014   S/P cesarean section 03/04/2014   Status post repeat low transverse cesarean section 03/04/2014   Threatened preterm labor    Morbid obesity (HCC) 01/26/2014   Positive GBS test 01/26/2014   Pregnancy complicated by previous recurrent miscarriages 01/26/2014   Adjustment disorder with mixed anxiety and depressed mood - dx'd at age 3, no meds 08/25/2013   Migraine headache 08/25/2013   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 Richardson Dopp, MD;  Location: [facility];  Service: Obstetrics;  Laterality: N/A;   SLEEVE GASTROPLASTY     WISDOM TOOTH EXTRACTION      OB History     Gravida  5   Para  2   Term  2   Preterm      AB  3   Living  2      SAB  3   IAB      Ectopic      Multiple  0   Live Births  2            Home Medications    Prior to Admission medications   Medication Sig Start Date End Date Taking? Authorizing Provider  Acetaminophen (TYLENOL PO) Take by mouth as needed.    [provider]  etonogestrel (NEXPLANON) 68 MG IMPL implant 68 mg by Subdermal route See admin instructions. Every 3 years    [provider]  levocetirizine (XYZAL) 2.5 MG/5ML solution Take 5 mLs (2.5 mg total) by mouth every evening. 02/23/22   Wallis Bamberg, PA-C  Multiple Vitamin (MULTIVITAMIN) capsule Take 1 capsule by mouth daily.    [provider]  norelgestromin-ethinyl estradiol [employer]) 150-35 MCG/24HR transdermal patch APPLY 1 PATCH ONCE  A WEEK 01/02/23   Chrzanowski, Lamona Curl, NP    Family History Family History  Problem Relation Age of Onset   Diabetes Father    Hypertension Father    Cancer Father    Cancer Maternal Grandmother    Diabetes Paternal Grandmother    Hypertension Paternal Grandmother     Social History Social History   Tobacco Use   Smoking status: Never   Smokeless tobacco: Never  Vaping Use   Vaping status: Never Used  Substance Use Topics   Alcohol use: Yes    Comment: occ   Drug use: No     Allergies   Patient has no known allergies.   Review of Systems Review of Systems  Constitutional:  Positive for fever.  Gastrointestinal:  Positive for abdominal pain and diarrhea.     Physical Exam Triage Vital Signs ED Triage Vitals [01/09/23 1238]  Encounter Vitals Group     BP 109/75      Systolic BP Percentile      Diastolic BP Percentile      Pulse Rate 71     Resp 17     Temp 98.9 F (37.2 C)     Temp Source Oral     SpO2 97 %     Weight      Height      Head Circumference      Peak Flow      Pain Score 9     Pain Loc      Pain Education      Exclude from Growth Chart    No data found.  Updated Vital Signs BP 109/75 (BP Location: Left Arm)   Pulse 71   Temp 98.9 F (37.2 C) (Oral)   Resp 17   LMP 12/18/2022 (Exact Date)   SpO2 97%   Visual Acuity Right Eye Distance:   Left Eye Distance:   Bilateral Distance:    Right Eye Near:   Left Eye Near:    Bilateral Near:     Physical Exam Vitals and nursing note reviewed.  Constitutional:      General: She is not in acute distress.    Appearance: Normal appearance. She is not ill-appearing.  HENT:     Head: Normocephalic and atraumatic.  Eyes:     Pupils: Pupils are equal, round, and reactive to light.  Cardiovascular:     Rate and Rhythm: Normal rate.  Pulmonary:     Effort: Pulmonary effort is normal.  Abdominal:     General: Bowel sounds are normal.     Palpations: There is no hepatomegaly or splenomegaly.     Tenderness: There is abdominal tenderness in the left upper quadrant and left lower quadrant. Negative signs include Rovsing's sign and McBurney's sign.  Skin:    General: Skin is warm and dry.  Neurological:     General: No focal deficit present.     Mental Status: She is alert and oriented to person, place, and time.  Psychiatric:        Mood and Affect: Mood normal.        Behavior: Behavior normal.      UC Treatments / Results  Labs (all labs ordered are listed, but only abnormal results are displayed) Labs Reviewed  POCT URINALYSIS DIP (MANUAL ENTRY)  POCT URINE PREGNANCY    EKG   Radiology No results found.  Procedures Procedures (including critical care time)  Medications Ordered in UC Medications - No data to display  Initial Impression / Assessment and  Plan / UC Course  I have reviewed the triage vital signs and the nursing notes.  Pertinent labs & imaging results that were available during my care of the patient were reviewed by me and considered in my medical decision making (see chart for details).     Reviewed exam and symptoms with patient.  Discussed limitations and abilities of urgent care.  Patient unable to leave urine sample for UA.  Discussed given her length of symptoms, reported blood in stool, and tenderness on exam I advise she go to the ER for further workup.  She is in agreement with plan will go POV to the emergency room. Final Clinical Impressions(s) / UC Diagnoses   Final diagnoses:  Left lower quadrant abdominal pain  Diarrhea, unspecified type     Discharge Instructions      Please go to the ER for further evaluation of your symptoms.     ED Prescriptions   None    PDMP not reviewed this encounter.   Radford Pax, NP 01/09/23 1339

## 2023-01-09 NOTE — ED Triage Notes (Signed)
Pt presents with c/o diverticulitis flare up.  Pt states she has diarrhea and abd pain X 2 wks.

## 2023-01-09 NOTE — ED Triage Notes (Signed)
Diarrhea constant all day Monday. Then she had sharp abdominal pain ,intermittent. States she was dx with diverticulitis on 11/1. She states urgent care directed her here for further workup.

## 2023-01-10 NOTE — Discharge Instructions (Signed)
Take ibuprofen 600 mg every 6 hours as needed for pain.  Follow-up with primary doctor if symptoms do not resolve in the next few days, and return to the ER if you develop worsening pain, high fevers, bloody stools, or for other new and concerning symptoms.

## 2023-01-10 NOTE — ED Provider Notes (Signed)
  Physical Exam  BP 100/78 (BP Location: Right Arm)   Pulse 73   Temp 98.1 F (36.7 C) (Oral)   Resp 16   Wt 88.5 kg   LMP 12/18/2022 (Exact Date) Comment: having periods  SpO2 100%   BMI 35.67 kg/m   Physical Exam Vitals and nursing note reviewed.  Pulmonary:     Effort: Pulmonary effort is normal.  Skin:    General: Skin is warm and dry.  Neurological:     Mental Status: She is alert and oriented to person, place, and time.     Procedures  Procedures  ED Course / MDM    Medical Decision Making Amount and/or Complexity of Data Reviewed Labs: ordered. Radiology: ordered.  Risk Prescription drug management.   Care assumed from Dr. Rubin Payor at shift change.  Patient awaiting results of CT scan.  She originally presented here with loose stools and left-sided abdominal pain.  Workup thus far reveals normal laboratory studies with no leukocytosis.  CT scan has resulted and shows no acute intra-abdominal process.  I suspect a viral etiology to her symptoms and believe she can safely be discharged.  I will recommend ibuprofen for pain, rest, and follow-up with primary doctor/return if symptoms persist or change.       Geoffery Lyons, MD 01/10/23 [phone]

## 2023-02-19 ENCOUNTER — Encounter (HOSPITAL_BASED_OUTPATIENT_CLINIC_OR_DEPARTMENT_OTHER): Payer: Self-pay | Admitting: General Surgery

## 2023-02-19 NOTE — Progress Notes (Signed)
Spoke w/ via phone for pre-op interview--- [facility] needs dos---- UPT per anesthesia. Surgeon orders requested 02/19/23.        Lab results------ COVID test -----patient states asymptomatic no test needed Arrive at -------0930 NPO after MN NO Solid Food.  Clear liquids from MN until---0830 Med rec completed Medications to take morning of surgery -----Topamax and Imitrex PRN Diabetic medication ----- Patient instructed no nail polish to be worn day of surgery Patient instructed to bring photo id and insurance card day of surgery Patient aware to have Driver (ride ) / caregiver    for 24 hours after surgery - Mother Sophiya Peavler Patient Special Instructions ----- Pre-Op special Instructions ----- Patient verbalized understanding of instructions that were given at this phone interview. Patient denies chest pain, sob, fever, cough at the interview.

## 2023-02-22 ENCOUNTER — Other Ambulatory Visit: Payer: Self-pay | Admitting: Radiology

## 2023-02-22 DIAGNOSIS — Z3045 Encounter for surveillance of transdermal patch hormonal contraceptive device: Secondary | ICD-10-CM

## 2023-02-22 NOTE — Telephone Encounter (Signed)
Med refill request:  Xulane patch, pharmacy requests 90 day supply  Last AEX: 01/25/22 Next AEX: none scheduled Last MMG (if hormonal med) n/a Refill denied.  Patient needs AEX.  Sent to provider for review.

## 2023-03-01 ENCOUNTER — Other Ambulatory Visit: Payer: Self-pay

## 2023-03-01 ENCOUNTER — Ambulatory Visit (HOSPITAL_BASED_OUTPATIENT_CLINIC_OR_DEPARTMENT_OTHER): Payer: No Typology Code available for payment source | Admitting: Anesthesiology

## 2023-03-01 ENCOUNTER — Encounter (HOSPITAL_BASED_OUTPATIENT_CLINIC_OR_DEPARTMENT_OTHER): Payer: Self-pay | Admitting: General Surgery

## 2023-03-01 ENCOUNTER — Ambulatory Visit (HOSPITAL_BASED_OUTPATIENT_CLINIC_OR_DEPARTMENT_OTHER): Payer: No Typology Code available for payment source

## 2023-03-01 ENCOUNTER — Ambulatory Visit (HOSPITAL_BASED_OUTPATIENT_CLINIC_OR_DEPARTMENT_OTHER)
Admission: RE | Admit: 2023-03-01 | Discharge: 2023-03-01 | Disposition: A | Discharge status: Home / Self Care | Payer: No Typology Code available for payment source | Attending: General Surgery | Admitting: General Surgery

## 2023-03-01 ENCOUNTER — Encounter (HOSPITAL_BASED_OUTPATIENT_CLINIC_OR_DEPARTMENT_OTHER)
Admission: RE | Disposition: A | Discharge status: Home / Self Care | Payer: Self-pay | Source: Home / Self Care | Attending: General Surgery

## 2023-03-01 DIAGNOSIS — E669 Obesity, unspecified: Secondary | ICD-10-CM | POA: Diagnosis not present

## 2023-03-01 DIAGNOSIS — Z01818 Encounter for other preprocedural examination: Secondary | ICD-10-CM

## 2023-03-01 DIAGNOSIS — Z6838 Body mass index (BMI) 38.0-38.9, adult: Secondary | ICD-10-CM | POA: Diagnosis not present

## 2023-03-01 DIAGNOSIS — S40852A Superficial foreign body of left upper arm, initial encounter: Secondary | ICD-10-CM

## 2023-03-01 DIAGNOSIS — Z3046 Encounter for surveillance of implantable subdermal contraceptive: Secondary | ICD-10-CM | POA: Diagnosis present

## 2023-03-01 HISTORY — PX: FOREIGN BODY REMOVAL: SHX962

## 2023-03-01 LAB — POCT PREGNANCY, URINE: Preg Test, Ur: NEGATIVE

## 2023-03-01 LAB — HCG, SERUM, QUALITATIVE: Preg, Serum: NEGATIVE

## 2023-03-01 SURGERY — FOREIGN BODY REMOVAL ADULT
Anesthesia: General | Site: Arm Upper | Laterality: Left

## 2023-03-01 MED ORDER — FENTANYL CITRATE (PF) 100 MCG/2ML IJ SOLN: 25.0000 ug | INTRAMUSCULAR | Status: DC | PRN

## 2023-03-01 MED ORDER — STERILE WATER FOR IRRIGATION IR SOLN
Status: DC | PRN
  Administered 2023-03-01: 500 mL

## 2023-03-01 MED ORDER — ONDANSETRON HCL 4 MG/2ML IJ SOLN
INTRAMUSCULAR | Status: AC
  Filled 2023-03-01: qty 2

## 2023-03-01 MED ORDER — ONDANSETRON HCL 4 MG/2ML IJ SOLN
INTRAMUSCULAR | Status: DC | PRN
  Administered 2023-03-01: 4 mg via INTRAVENOUS

## 2023-03-01 MED ORDER — OXYCODONE HCL 5 MG/5ML PO SOLN: 5.0000 mg | Freq: Once | ORAL | Status: DC | PRN

## 2023-03-01 MED ORDER — LIDOCAINE HCL (PF) 2 % IJ SOLN
INTRAMUSCULAR | Status: AC
  Filled 2023-03-01: qty 5

## 2023-03-01 MED ORDER — IBUPROFEN 800 MG PO TABS: 800.0000 mg | ORAL_TABLET | Freq: Three times a day (TID) | ORAL | 0 refills | Status: AC | PRN

## 2023-03-01 MED ORDER — BUPIVACAINE-EPINEPHRINE (PF) 0.25% -1:200000 IJ SOLN
INTRAMUSCULAR | Status: DC | PRN
  Administered 2023-03-01: 12 mL via PERINEURAL

## 2023-03-01 MED ORDER — DEXMEDETOMIDINE HCL IN NACL 80 MCG/20ML IV SOLN
INTRAVENOUS | Status: DC | PRN
  Administered 2023-03-01: 4 ug via INTRAVENOUS

## 2023-03-01 MED ORDER — CHLORHEXIDINE GLUCONATE CLOTH 2 % EX PADS: 6.0000 | MEDICATED_PAD | Freq: Once | CUTANEOUS | Status: DC

## 2023-03-01 MED ORDER — ACETAMINOPHEN 500 MG PO TABS
ORAL_TABLET | ORAL | Status: AC
  Filled 2023-03-01: qty 2

## 2023-03-01 MED ORDER — FENTANYL CITRATE (PF) 100 MCG/2ML IJ SOLN
INTRAMUSCULAR | Status: AC
  Filled 2023-03-01: qty 2

## 2023-03-01 MED ORDER — PROPOFOL 10 MG/ML IV BOLUS
INTRAVENOUS | Status: DC | PRN
  Administered 2023-03-01: 20 mg via INTRAVENOUS
  Administered 2023-03-01: 10 mg via INTRAVENOUS
  Administered 2023-03-01: 200 mg via INTRAVENOUS

## 2023-03-01 MED ORDER — CEFAZOLIN SODIUM-DEXTROSE 2-4 GM/100ML-% IV SOLN
2.0000 g | INTRAVENOUS | Status: AC
  Administered 2023-03-01: 2 g via INTRAVENOUS

## 2023-03-01 MED ORDER — MIDAZOLAM HCL 5 MG/5ML IJ SOLN
INTRAMUSCULAR | Status: DC | PRN
  Administered 2023-03-01: 2 mg via INTRAVENOUS

## 2023-03-01 MED ORDER — SODIUM CHLORIDE 0.9 % IV SOLN: 12.5000 mg | INTRAVENOUS | Status: DC | PRN

## 2023-03-01 MED ORDER — FENTANYL CITRATE (PF) 100 MCG/2ML IJ SOLN
INTRAMUSCULAR | Status: DC | PRN
  Administered 2023-03-01: 100 ug via INTRAVENOUS

## 2023-03-01 MED ORDER — PROPOFOL 10 MG/ML IV BOLUS
INTRAVENOUS | Status: AC
  Filled 2023-03-01: qty 20

## 2023-03-01 MED ORDER — ACETAMINOPHEN 500 MG PO TABS: 1000.0000 mg | ORAL_TABLET | ORAL | Status: DC

## 2023-03-01 MED ORDER — MIDAZOLAM HCL 2 MG/2ML IJ SOLN
INTRAMUSCULAR | Status: AC
  Filled 2023-03-01: qty 2

## 2023-03-01 MED ORDER — KETOROLAC TROMETHAMINE 30 MG/ML IJ SOLN
INTRAMUSCULAR | Status: DC | PRN
  Administered 2023-03-01: 30 mg via INTRAVENOUS

## 2023-03-01 MED ORDER — LIDOCAINE 2% (20 MG/ML) 5 ML SYRINGE
INTRAMUSCULAR | Status: DC | PRN
  Administered 2023-03-01: 80 mg via INTRAVENOUS

## 2023-03-01 MED ORDER — KETOROLAC TROMETHAMINE 30 MG/ML IJ SOLN
INTRAMUSCULAR | Status: AC
  Filled 2023-03-01: qty 2

## 2023-03-01 MED ORDER — DEXAMETHASONE SODIUM PHOSPHATE 10 MG/ML IJ SOLN
INTRAMUSCULAR | Status: AC
  Filled 2023-03-01: qty 1

## 2023-03-01 MED ORDER — DEXAMETHASONE SODIUM PHOSPHATE 10 MG/ML IJ SOLN
INTRAMUSCULAR | Status: DC | PRN
  Administered 2023-03-01: 5 mg via INTRAVENOUS

## 2023-03-01 MED ORDER — AMISULPRIDE (ANTIEMETIC) 5 MG/2ML IV SOLN: 10.0000 mg | Freq: Once | INTRAVENOUS | Status: DC | PRN

## 2023-03-01 MED ORDER — CEFAZOLIN SODIUM-DEXTROSE 2-4 GM/100ML-% IV SOLN
INTRAVENOUS | Status: AC
  Filled 2023-03-01: qty 100

## 2023-03-01 MED ORDER — DEXMEDETOMIDINE HCL IN NACL 80 MCG/20ML IV SOLN
INTRAVENOUS | Status: AC
Start: 2023-03-01 — End: ?
  Filled 2023-03-01: qty 20

## 2023-03-01 MED ORDER — OXYCODONE HCL 5 MG PO TABS: 5.0000 mg | ORAL_TABLET | Freq: Once | ORAL | Status: DC | PRN

## 2023-03-01 MED ORDER — SODIUM CHLORIDE 0.9 % IV SOLN: INTRAVENOUS | Status: DC

## 2023-03-01 SURGICAL SUPPLY — 48 items
BENZOIN TINCTURE PRP APPL 2/3 (GAUZE/BANDAGES/DRESSINGS) IMPLANT
BLADE CLIPPER SENSICLIP SURGIC (BLADE) IMPLANT
BLADE SURG 15 STRL LF DISP TIS (BLADE) ×1 IMPLANT
BNDG COHESIVE 4X5 TAN STRL LF (GAUZE/BANDAGES/DRESSINGS) IMPLANT
BNDG GAUZE DERMACEA FLUFF 4 (GAUZE/BANDAGES/DRESSINGS) IMPLANT
CHLORAPREP W/TINT 26 (MISCELLANEOUS) ×1 IMPLANT
COVER BACK TABLE 60X90IN (DRAPES) ×1 IMPLANT
COVER MAYO STAND STRL (DRAPES) ×1 IMPLANT
COVER PROBE W GEL 5X96 (DRAPES) IMPLANT
DERMABOND ADVANCED .7 DNX12 (GAUZE/BANDAGES/DRESSINGS) IMPLANT
DRAIN PENROSE 0.25X18 (DRAIN) IMPLANT
DRAIN PENROSE 0.5X18 (DRAIN) IMPLANT
DRAPE EXTREMITY T 121X128X90 (DISPOSABLE) IMPLANT
DRAPE LAPAROTOMY 100X72 PEDS (DRAPES) ×1 IMPLANT
DRAPE UTILITY XL STRL (DRAPES) ×1 IMPLANT
DRSG TEGADERM 4X4.75 (GAUZE/BANDAGES/DRESSINGS) IMPLANT
ELECT COATED BLADE 2.86 ST (ELECTRODE) IMPLANT
ELECT REM PT RETURN 9FT ADLT (ELECTROSURGICAL) ×1
ELECTRODE REM PT RTRN 9FT ADLT (ELECTROSURGICAL) ×1 IMPLANT
GAUZE 4X4 16PLY ~~LOC~~+RFID DBL (SPONGE) ×1 IMPLANT
GAUZE SPONGE 4X4 12PLY STRL (GAUZE/BANDAGES/DRESSINGS) IMPLANT
GLOVE BIOGEL PI IND STRL 7.0 (GLOVE) ×1 IMPLANT
GLOVE INDICATOR 7.0 STRL GRN (GLOVE) IMPLANT
GLOVE INDICATOR 7.5 STRL GRN (GLOVE) IMPLANT
GLOVE SURG SS PI 7.0 STRL IVOR (GLOVE) ×1 IMPLANT
GOWN STRL REUS W/TWL LRG LVL3 (GOWN DISPOSABLE) ×1 IMPLANT
KIT TURNOVER CYSTO (KITS) ×1 IMPLANT
NDL HYPO 25X1 1.5 SAFETY (NEEDLE) ×1 IMPLANT
NEEDLE HYPO 25X1 1.5 SAFETY (NEEDLE) ×1
NS IRRIG 500ML POUR BTL (IV SOLUTION) IMPLANT
PACK BASIN DAY SURGERY FS (CUSTOM PROCEDURE TRAY) ×1 IMPLANT
PENCIL SMOKE EVACUATOR (MISCELLANEOUS) IMPLANT
SLEEVE SCD COMPRESS KNEE MED (STOCKING) ×1 IMPLANT
SPIKE FLUID TRANSFER (MISCELLANEOUS) IMPLANT
SPONGE T-LAP 4X18 ~~LOC~~+RFID (SPONGE) IMPLANT
STOCKINETTE IMPERVIOUS LG (DRAPES) IMPLANT
STRIP CLOSURE SKIN 1/2X4 (GAUZE/BANDAGES/DRESSINGS) IMPLANT
SUCTION TUBE FRAZIER 10FR DISP (SUCTIONS) IMPLANT
SUT ETHILON 3 0 FSL (SUTURE) ×1 IMPLANT
SUT MNCRL AB 4-0 PS2 18 (SUTURE) IMPLANT
SUT VIC AB 2-0 SH 27XBRD (SUTURE) IMPLANT
SUT VIC AB 3-0 SH 27X BRD (SUTURE) IMPLANT
SYR BULB IRRIG 60ML STRL (SYRINGE) IMPLANT
SYR CONTROL 10ML LL (SYRINGE) ×1 IMPLANT
TOWEL OR 17X24 6PK STRL BLUE (TOWEL DISPOSABLE) ×1 IMPLANT
TUBE CONNECTING 12X1/4 (SUCTIONS) IMPLANT
WATER STERILE IRR 500ML POUR (IV SOLUTION) IMPLANT
YANKAUER SUCT BULB TIP NO VENT (SUCTIONS) IMPLANT

## 2023-03-01 NOTE — H&P (Signed)
 Chief Complaint: Discuss Nexplanon Removal  History of Present Illness: Mary Whitehead is a 33 y.o. female who is seen today as an office consultation for evaluation of Discuss Nexplanon Removal  She had nexplanon placed in her area in 2020. At first she had no periods but now she has irregular but returned periods. She had weight loss surgery last year (sleeve gastrectomy) so tissue has rearranged some and her gynecologist could not identify the foreign body. She has some bilateral hand weakness at times but no focal neuro issues of the left arm or hand. This is the second nexplanon placed the first in the medial groove but the second was placed in a posterior region.  Review of Systems: A complete review of systems was obtained from the patient. I have reviewed this information and discussed as appropriate with the patient. See HPI as well for other ROS.  Review of Systems  Constitutional: Negative.  HENT: Negative.  Eyes: Negative.  Respiratory: Negative.  Cardiovascular: Negative.  Gastrointestinal: Negative.  Genitourinary: Negative.  Musculoskeletal: Negative.  Skin: Negative.  Neurological: Negative.  Endo/Heme/Allergies: Negative.  Psychiatric/Behavioral: Negative.    Medical History: Past Medical History:  Diagnosis Date  Anemia  Anxiety   There is no problem list on file for this patient.  Past Surgical History:  Procedure Laterality Date  CESAREAN SECTION 08/03/2009  CESAREAN SECTION 03/04/2014  Dr. IVAR Hoit  Robotic Sleeve Gastrectomy 10/25/2020    No Known Allergies  Current Outpatient Medications on File Prior to Visit  Medication Sig Dispense Refill  cholecalciferol, vitamin D3, 250 mcg (10,000 unit) capsule Take by mouth every 7 (seven) days  multivitamin 400 mcg tablet Take 1 tablet by mouth once daily  CALCIUM  ORAL Take by mouth  docusate sodium  (STOOL SOFTENER ORAL) Take by mouth   No current facility-administered medications on file prior to visit.    Family History  Problem Relation Age of Onset  Breast cancer Mother  High blood pressure (Hypertension) Father  Diabetes Father  Deep vein thrombosis (DVT or abnormal blood clot formation) Father    Social History   Tobacco Use  Smoking Status Never  Smokeless Tobacco Never    Social History   Socioeconomic History  Marital status: Single  Tobacco Use  Smoking status: Never  Smokeless tobacco: Never  Substance and Sexual Activity  Alcohol use: Yes  Comment: Sometimes  Drug use: Never   Objective:   Vitals:  05/17/22 1059  BP: 108/68  Pulse: 94  Temp: 36.6 C (97.9 F)  SpO2: 96%  Weight: 92.5 kg (204 lb)  Height: 157.5 cm (5' 2)  PainSc: 0-No pain   Body mass index is 37.31 kg/m.  Physical Exam Constitutional:  Appearance: Normal appearance.  HENT:  Head: Normocephalic and atraumatic.  Pulmonary:  Effort: Pulmonary effort is normal.  Musculoskeletal:  General: Normal range of motion.  Cervical back: Normal range of motion.  Skin: Comments: No palpable masses in the skin under the tricep or the bicep groove  Neurological:  General: No focal deficit present.  Mental Status: She is alert and oriented to person, place, and time. Mental status is at baseline.  Psychiatric:  Mood and Affect: Mood normal.  Behavior: Behavior normal.  Thought Content: Thought content normal.     Labs, Imaging and Diagnostic Testing: I reviewed Dr Modesto notes  Assessment and Plan:   Diagnoses and all orders for this visit:  Nexplanon in place - X-ray humerus left minimum 2 views; Future   The pathophysiology of skin &  subcutaneous foreign bodies was discussed. Natural history risks without surgery were discussed. I recommended surgery to remove the hormonally active foreign bodies. I explained the technique of removal with use of local anesthesia & possible need for more aggressive sedation/anesthesia for patient comfort.   Risks such as bleeding, infection,  wound breakdown, heart attack, death, and other risks were discussed. I noted a good likelihood this will help address the problem. Possibility that this will not correct all symptoms was explained. We will get an XR to confirm FB and proceed with excision at a surgical center.

## 2023-03-01 NOTE — Anesthesia Preprocedure Evaluation (Addendum)
 Anesthesia Evaluation  Patient identified by MRN, date of birth, ID band Patient awake    Reviewed: Allergy & Precautions, NPO status , Patient's Chart, lab work & pertinent test results  History of Anesthesia Complications Negative for: history of anesthetic complications  Airway Mallampati: III  TM Distance: >3 FB Neck ROM: Full    Dental  (+) Dental Advisory Given, Teeth Intact   Pulmonary neg pulmonary ROS   Pulmonary exam normal        Cardiovascular negative cardio ROS Normal cardiovascular exam     Neuro/Psych  Headaches PSYCHIATRIC DISORDERS Anxiety Depression       GI/Hepatic negative GI ROS, Neg liver ROS,,,  Endo/Other   Obesity   Renal/GU negative Renal ROS     Musculoskeletal negative musculoskeletal ROS (+)    Abdominal   Peds  Hematology negative hematology ROS (+)   Anesthesia Other Findings   Reproductive/Obstetrics                             Anesthesia Physical Anesthesia Plan  ASA: 2  Anesthesia Plan: General   Post-op Pain Management: Tylenol  PO (pre-op)*   Induction: Intravenous  PONV Risk Score and Plan: 3 and Treatment may vary due to age or medical condition, Ondansetron , Dexamethasone  and Midazolam   Airway Management Planned: LMA  Additional Equipment: None  Intra-op Plan:   Post-operative Plan: Extubation in OR  Informed Consent: I have reviewed the patients History and Physical, chart, labs and discussed the procedure including the risks, benefits and alternatives for the proposed anesthesia with the patient or authorized representative who has indicated his/her understanding and acceptance.     Dental advisory given  Plan Discussed with: CRNA and Anesthesiologist  Anesthesia Plan Comments:        Anesthesia Quick Evaluation

## 2023-03-01 NOTE — Addendum Note (Signed)
 Addendum  created 03/01/23 1429 by Jairo Ben, MD   Attestation recorded in Intraprocedure, Intraprocedure Attestations filed, Intraprocedure Event edited

## 2023-03-01 NOTE — Anesthesia Postprocedure Evaluation (Signed)
 Anesthesia Post Note  Patient: Mary Whitehead  Procedure(s) Performed: REMOVAL FOREIGN BODY LEFT ARM (Left: Arm Upper)     Patient location during evaluation: PACU Anesthesia Type: General Level of consciousness: awake and alert and oriented Pain management: pain level controlled Vital Signs Assessment: post-procedure vital signs reviewed and stable Respiratory status: spontaneous breathing, nonlabored ventilation and respiratory function stable Cardiovascular status: blood pressure returned to baseline and stable Postop Assessment: no apparent nausea or vomiting Anesthetic complications: no   No notable events documented.  Last Vitals:  Vitals:   03/01/23 1346 03/01/23 1349  BP: (!) 89/31 103/69  Pulse: 71 70  Resp: 13 15  Temp:  36.6 C  SpO2: 100% 100%    Last Pain:  Vitals:   03/01/23 0937  TempSrc: Oral                 [name] A.

## 2023-03-01 NOTE — Anesthesia Procedure Notes (Signed)
 Procedure Name: LMA Insertion Date/Time: 03/01/2023 11:52 AM  Performed by: Franchot Delon RAMAN, CRNAPre-anesthesia Checklist: Patient identified, Emergency Drugs available, Suction available and Patient being monitored Patient Re-evaluated:Patient Re-evaluated prior to induction Oxygen Delivery Method: Circle System Utilized Preoxygenation: Pre-oxygenation with 100% oxygen Induction Type: IV induction Ventilation: Mask ventilation without difficulty LMA: LMA inserted LMA Size: 4.0 Number of attempts: 1 Placement Confirmation: positive ETCO2 Tube secured with: Tape Dental Injury: Teeth and Oropharynx as per pre-operative assessment

## 2023-03-01 NOTE — Transfer of Care (Signed)
 Immediate Anesthesia Transfer of Care Note  Patient: Mary Whitehead  Procedure(s) Performed: REMOVAL FOREIGN BODY LEFT ARM (Left: Arm Upper)  Patient Location: PACU  Anesthesia Type:General  Level of Consciousness: drowsy, patient cooperative, and responds to stimulation  Airway & Oxygen Therapy: Patient Spontanous Breathing  Post-op Assessment: Report given to RN and Post -op Vital signs reviewed and stable  Post vital signs: Reviewed and stable  Last Vitals:  Vitals Value Taken Time  BP 93/58 03/01/23 1308  Temp 36.3 C 03/01/23 1308  Pulse 85 03/01/23 1309  Resp 19 03/01/23 1311  SpO2 96 % 03/01/23 1309  Vitals shown include unfiled device data.  Last Pain:  Vitals:   03/01/23 0937  TempSrc: Oral         Complications: No notable events documented.

## 2023-03-01 NOTE — Op Note (Signed)
 Preoperative diagnosis: foreign body in left upper arm  Postoperative diagnosis: same   Procedure: removal of nexplanon from left upper arm  Surgeon: Herlene Bureau, M.D.  Asst: none  Anesthesia: LMA  Indications for procedure: Mary Whitehead is a 33 y.o. year old female with history of nexplanon device that has been unable to be removed. She presents for surgical removal.  Description of procedure: The patient was brought into the operative suite. Anesthesia was administered with general LMA. WHO checklist was applied. The patient was then placed in supine position. The area was prepped and draped in the usual sterile fashion.  Next, US  and fluro were used to identify the foreign body. It was located in the posterior medial area. Marcaine  was injected over the foreign body. A small incision was made. Blunt dissection was used to identify the foreign body. Palpation and additional dissection was used to identify the foreign body. The foreign body was isolated away from the surrounding tissue and removed. The subcutaneous tissue was closed with 3-0 vicryl. The skin was closed with interrupted 4-0 monocryl. Dermabond was put in place for dressing.  Findings: intact nexplanon device  Specimen: none  Implant: none   Blood loss: 5 ml  Local anesthesia:  10 ml marcaine    Complications: none  Herlene Bureau, M.D. General, Bariatric, & Minimally Invasive Surgery [facility]

## 2023-03-01 NOTE — OR Nursing (Signed)
 Nexplanon removed from left upper arm by Dr. Sheliah Hatch. Nexplanon intact.

## 2023-03-02 ENCOUNTER — Encounter (HOSPITAL_BASED_OUTPATIENT_CLINIC_OR_DEPARTMENT_OTHER): Payer: Self-pay | Admitting: General Surgery

## 2023-04-05 ENCOUNTER — Institutional Professional Consult (permissible substitution): Payer: No Typology Code available for payment source | Admitting: Plastic Surgery

## 2023-04-18 ENCOUNTER — Institutional Professional Consult (permissible substitution): Payer: No Typology Code available for payment source | Admitting: Plastic Surgery

## 2023-05-16 ENCOUNTER — Encounter: Payer: Self-pay | Admitting: Plastic Surgery

## 2023-05-16 ENCOUNTER — Ambulatory Visit: Payer: No Typology Code available for payment source | Admitting: Plastic Surgery

## 2023-05-16 VITALS — BP 103/72 | HR 87 | Ht 62.0 in | Wt 211.6 lb

## 2023-05-16 DIAGNOSIS — Z9884 Bariatric surgery status: Secondary | ICD-10-CM | POA: Diagnosis not present

## 2023-05-16 DIAGNOSIS — E65 Localized adiposity: Secondary | ICD-10-CM | POA: Diagnosis not present

## 2023-05-16 DIAGNOSIS — M793 Panniculitis, unspecified: Secondary | ICD-10-CM | POA: Diagnosis not present

## 2023-05-16 NOTE — Progress Notes (Signed)
 Referring Provider Augustine Radar, FNP [address] [place],  [place] [address]   CC:  Chief Complaint  Patient presents with   Advice Only      Mary Whitehead is an 33 y.o. female.  HPI: Mary Whitehead is a 33 year old female who underwent a gastric sleeve bariatric surgery in 2022.  She is lost approximately 63 pounds.  She has noted for the past 2 years that the increase in the skin and fat on the anterior abdominal wall has begun to interfere with her daily activities.  She has frequent rashes which she has treated with witch hazel oil and antifungal creams.  She states that these treatments work for short period of time but as soon as they are stopped the rashes recur.  She is interested in having the excess skin and fat removed.  No Known Allergies  Outpatient Encounter Medications as of 05/16/2023  Medication Sig   Acetaminophen (TYLENOL PO) Take by mouth as needed.   ibuprofen (ADVIL) 800 MG tablet Take 1 tablet (800 mg total) by mouth every 8 (eight) hours as needed.   Multiple Vitamin (MULTIVITAMIN) capsule Take 1 capsule by mouth daily.   norelgestromin-ethinyl estradiol [employer]) 150-35 MCG/24HR transdermal patch APPLY 1 PATCH ONCE A WEEK   SUMAtriptan (IMITREX) 50 MG tablet Take 50 mg by mouth every 2 (two) hours as needed for migraine. May repeat in 2 hours if headache persists or recurs.   topiramate (TOPAMAX) 50 MG tablet Take 50 mg by mouth 2 (two) times daily.   [DISCONTINUED] etonogestrel (NEXPLANON) 68 MG IMPL implant 68 mg by Subdermal route See admin instructions. Every 3 years   [DISCONTINUED] levocetirizine (XYZAL) 2.5 MG/5ML solution Take 5 mLs (2.5 mg total) by mouth every evening. (Patient not taking: Reported on 02/19/2023)   No facility-administered encounter medications on file as of 05/16/2023.     Past Medical History:  Diagnosis Date   Adenomyosis    Anxiety    Depression    Headache(784.0) 08/02/2012   Migraines   Obesity    STD (sexually  transmitted disease)    trichomonas 03-13-22 treated    Past Surgical History:  Procedure Laterality Date   CESAREAN SECTION     CESAREAN SECTION N/A 03/04/2014   Procedure: CESAREAN SECTION;  Surgeon: Dorien Chihuahua. Richardson Dopp, MD;  Location: [facility];  Service: Obstetrics;  Laterality: N/A;   FOREIGN BODY REMOVAL Left 03/01/2023   Procedure: REMOVAL FOREIGN BODY LEFT ARM;  Surgeon: Kinsinger, De Blanch, MD;  Location: [facility] [place];  Service: General;  Laterality: Left;   SLEEVE GASTROPLASTY     WISDOM TOOTH EXTRACTION      Family History  Problem Relation Age of Onset   Diabetes Father    Hypertension Father    Cancer Father    Cancer Maternal Grandmother    Diabetes Paternal Grandmother    Hypertension Paternal Grandmother     Social History   Social History Narrative   Not on file     Review of Systems General: Denies fevers, chills, weight loss CV: Denies chest pain, shortness of breath, palpitations Abdomen: Excess skin and fat which interferes with daily activities including bending over.  Recurrent and frequent rashes on the posterior aspect of the pannus and in the intertriginous regions  Physical Exam    05/16/2023    9:20 AM 03/01/2023    2:15 PM 03/01/2023    1:49 PM  Vitals with BMI  Height 5\' 2"     Weight 211  lbs 10 oz    BMI 38.69    Systolic 103 104 161  Diastolic 72 73 69  Pulse 87 72 70    General:  No acute distress,  Alert and oriented, Non-Toxic, Normal speech and affect Abdomen: Patient has excess skin and fat on the anterior abdominal wall which extends down to the level of the symphysis pubis.  There is darkening on the posterior aspect of the pannus especially along a previous C-section scar. Mammogram: Not applicable due to age Assessment/Plan Pannus: Patient complains of difficulty with daily activities and frequent rashes on the posterior aspect of the pannus.  She would likely benefit from a panniculectomy.  We discussed the procedure at  length including the location of the incisions and exactly what would be resected.  We discussed the unpredictable nature of scarring and wound healing.  We discussed the risk of bleeding, infection, and seroma formation.  She understands I will use drains postoperatively and these may be in place for up to 4 weeks.  She understands that she will need to wear compressive garment for 6 weeks postoperatively.  We discussed the importance of early ambulation to prevent DVT.  We also discussed the postoperative limitations including no heavy lifting greater than 20 pounds, no vigorous activity, no submerging the incisions in water for 6 weeks.  She may return to light activity as desired.  All questions were answered to her satisfaction.  Photographs were obtained today with her consent.  Will submit her for panniculectomy at her request.  I did discuss with her that she has had a recent weight gain and her BMI is now 38.7.  She understands that 1 she will get a better cosmetic result if she loses more weight and to if her BMI exceeds 40 that I will delay her surgery until she can get her weight back down.  [name] 05/16/2023, 12:35 PM

## 2023-05-21 ENCOUNTER — Other Ambulatory Visit: Payer: Self-pay | Admitting: Radiology

## 2023-05-21 DIAGNOSIS — Z3045 Encounter for surveillance of transdermal patch hormonal contraceptive device: Secondary | ICD-10-CM

## 2023-05-21 NOTE — Telephone Encounter (Signed)
 Med refill request: Xulane Last AEX: 01/25/2022 Next AEX: not scheduled, sent message to front desk for patient to schedule annual visit Last MMG (if hormonal med) n/a Refill authorized: Last Rx sent #[id number] with zero refills on 02/22/2023. Please approve or deny as appropriate.

## 2023-06-11 ENCOUNTER — Ambulatory Visit: Admitting: Radiology

## 2023-06-20 ENCOUNTER — Encounter: Payer: Self-pay | Admitting: Physician Assistant

## 2023-06-20 ENCOUNTER — Ambulatory Visit (INDEPENDENT_AMBULATORY_CARE_PROVIDER_SITE_OTHER): Admitting: Physician Assistant

## 2023-06-20 VITALS — HR 72 | Ht 62.0 in | Wt 213.6 lb

## 2023-06-20 DIAGNOSIS — M793 Panniculitis, unspecified: Secondary | ICD-10-CM

## 2023-06-20 MED ORDER — ONDANSETRON 4 MG PO TBDP: 4.0000 mg | ORAL_TABLET | Freq: Three times a day (TID) | ORAL | 0 refills | Status: AC | PRN

## 2023-06-20 MED ORDER — OXYCODONE HCL 5 MG PO TABS: 5.0000 mg | ORAL_TABLET | Freq: Three times a day (TID) | ORAL | 0 refills | Status: AC | PRN

## 2023-06-20 NOTE — H&P (View-Only) (Signed)
 Patient ID: Mary Whitehead, female    DOB: [birth year], 33 y.o.   MRN: [id number]  Chief Complaint  Patient presents with   Pre-op Exam      ICD-10-CM   1. Panniculitis  M79.3        History of Present Illness: Mary Whitehead is a 33 y.o.  female  with a history of panniculitis.  She presents for preoperative evaluation for upcoming procedure, panniculectomy, scheduled for 07/13/2023 with Dr.  Carolynne Citron .  The patient has not had problems with anesthesia.  Previous surgeries without complication.  She states that her father has had multiple DVTs in his legs, but attributes it to overall poor health.  She reports that she personally saw hematology and had workup done which did not reveal any personal increased risks of bleeding or clotting.  She denies any personal or other family history of clotting.  She denies any significant history of cardiac or pulmonary disease, nicotine use, varicosities, or inflammatory bowel disease.  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  She uses transdermal birth control patch.  She has 2 sons at home, aged 24 and 27.  Her mother will be assisting with her postoperative recovery.  She will hold her multivitamin 1 week prior to surgery.  Her weight is stable since last encounter and BMI still does not exceed 40 kg/m.  She understands that if she continues with her weight loss journey after surgery it could result in additional excess skin.  She also understands the increased risks associated with obesity at time of surgery.  She would like to proceed.  Summary of Previous Visit: Patient was seen for initial consult 05/16/2023.  At that time, reported 63 pound weight loss after bariatric surgery 2022.  Complained of associated infra pannus rashes refractory to medical management.  On exam, there was excess skin and fat on the anterior abdominal wall extending down to the level of the symphysis pubis.  At time of evaluation, her BMI was 38.7 kg/m.   It was discussed with the patient that she would have a better cosmetic result if she loses more weight prior to surgery and that surgery will be delayed if her BMI exceeds 40 kg/m.  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  Discussed 10 days time off for postoperative recovery, returning Monday 07/23/2023.  PMH Significant for: History of C-section, history of gastric sleeve bariatric surgery, recurrent panniculitis, migraine disorder, anxiety and depression.   Past Medical History: Allergies: No Known Allergies  Current Medications:  Current Outpatient Medications:    Acetaminophen  (TYLENOL  PO), Take by mouth as needed., Disp: , Rfl:    ibuprofen  (ADVIL ) 800 MG tablet, Take 1 tablet (800 mg total) by mouth every 8 (eight) hours as needed., Disp: 30 tablet, Rfl: 0   Multiple Vitamin (MULTIVITAMIN) capsule, Take 1 capsule by mouth daily., Disp: , Rfl:    norelgestromin -ethinyl estradiol  (XULANE) 150-35 MCG/24HR transdermal patch, APPLY 1 PATCH ONCE A WEEK, Disp: 9 patch, Rfl: 0   ondansetron  (ZOFRAN -ODT) 4 MG disintegrating tablet, Take 1 tablet (4 mg total) by mouth every 8 (eight) hours as needed for nausea or vomiting., Disp: 20 tablet, Rfl: 0   oxyCODONE  (ROXICODONE ) 5 MG immediate release tablet, Take 1 tablet (5 mg total) by mouth every 8 (eight) hours as needed for up to 5 days for severe pain (pain score 7-10)., Disp: 15 tablet, Rfl: 0   SUMAtriptan (IMITREX) 50 MG tablet, Take 50 mg  by mouth every 2 (two) hours as needed for migraine. May repeat in 2 hours if headache persists or recurs., Disp: , Rfl:    topiramate (TOPAMAX) 50 MG tablet, Take 50 mg by mouth 2 (two) times daily., Disp: , Rfl:   Past Medical Problems: Past Medical History:  Diagnosis Date   Adenomyosis    Anxiety    Depression    Headache(784.0) 08/02/2012   Migraines   Obesity    STD (sexually transmitted disease)    trichomonas 03-13-22 treated    Past Surgical History: Past Surgical  History:  Procedure Laterality Date   CESAREAN SECTION     CESAREAN SECTION N/A 03/04/2014   Procedure: CESAREAN SECTION;  Surgeon: Lizette Righter. Wynona Hedger, MD;  Location: [facility];  Service: Obstetrics;  Laterality: N/A;   FOREIGN BODY REMOVAL Left 03/01/2023   Procedure: REMOVAL FOREIGN BODY LEFT ARM;  Surgeon: Kinsinger, Alphonso Aschoff, MD;  Location: [facility] [place];  Service: General;  Laterality: Left;   SLEEVE GASTROPLASTY     WISDOM TOOTH EXTRACTION      Social History: Social History   Socioeconomic History   Marital status: Single    Spouse name: Not on file   Number of children: Not on file   Years of education: Not on file   Highest education level: Not on file  Occupational History   Not on file  Tobacco Use   Smoking status: Never   Smokeless tobacco: Never  Vaping Use   Vaping status: Never Used  Substance and Sexual Activity   Alcohol use: Yes    Comment: occ   Drug use: No   Sexual activity: Not Currently    Partners: Male    Birth control/protection: Implant, Abstinence  Other Topics Concern   Not on file  Social History Narrative   Not on file   Social Drivers of Health   Financial Resource Strain: Low Risk  (07/18/2022)   Received from [facility]   Overall Financial Resource Strain (CARDIA)    Difficulty of Paying Living Expenses: Not hard at all  Food Insecurity: No Food Insecurity (07/18/2022)   Received from [facility]   Hunger Vital Sign    Worried About Running Out of Food in the Last Year: Never true    Ran Out of Food in the Last Year: Never true  Transportation Needs: No Transportation Needs (07/18/2022)   Received from [facility] - Transportation    Lack of Transportation (Medical): No    Lack of Transportation (Non-Medical): No  Physical Activity: Insufficiently Active (07/18/2022)   Received from [facility]   Exercise Vital Sign    Days of Exercise per Week: 4 days    Minutes of Exercise per Session: 30 min  Stress:  Stress Concern Present (07/18/2022)   Received from [facility] - Occupational Stress Questionnaire    Feeling of Stress : Rather much  Social Connections: Socially Isolated (07/18/2022)   Received from [facility]   Social Connection and Isolation Panel [NHANES]    Frequency of Communication with Friends and Family: More than three times a week    Frequency of Social Gatherings with Friends and Family: More than three times a week    Attends Religious Services: Never    Database administrator or Organizations: No    Attends Banker Meetings: Never    Marital Status: Never married  Intimate Partner Violence: Not At Risk (07/18/2022)  Received from [facility]   Humiliation, Afraid, Rape, and Kick questionnaire    Fear of Current or Ex-Partner: No    Emotionally Abused: No    Physically Abused: No    Sexually Abused: No    Family History: Family History  Problem Relation Age of Onset   Diabetes Father    Hypertension Father    Cancer Father    Cancer Maternal Grandmother    Diabetes Paternal Grandmother    Hypertension Paternal Grandmother     Review of Systems: ROS Denies any recent chest pain, difficulty breathing, leg swelling, fevers.  Physical Exam: Vital Signs Pulse 72   Ht 5\' 2"  (1.575 m)   Wt 213 lb 9.6 oz (96.9 kg)   SpO2 100%   BMI 39.07 kg/m   Physical Exam Constitutional:      General: Not in acute distress.    Appearance: Normal appearance. Not ill-appearing.  HENT:     Head: Normocephalic and atraumatic.  Eyes:     Pupils: Pupils are equal, round. Cardiovascular:     Rate and Rhythm: Normal rate.    Pulses: Normal pulses.  Pulmonary:     Effort: No respiratory distress or increased work of breathing.  Speaks in full sentences. Abdominal:     General: Abdomen is flat. No distension.   Musculoskeletal: Normal range of motion. No lower extremity swelling or edema. No  varicosities. Skin:    General: Skin is warm and dry.     Findings: No erythema or rash.  Neurological:     Mental Status: Alert and oriented to person, place, and time.  Psychiatric:        Mood and Affect: Mood normal.        Behavior: Behavior normal.    Assessment/Plan: The patient is scheduled for panniculectomy with Dr.  Carolynne Citron .  Risks, benefits, and alternatives of procedure discussed, questions answered and consent obtained.    Smoking Status: Non-smoker.  Caprini Score: 7; Risk Factors include: BMI greater than 25, multiple spontaneous miscarriages, family history of thrombosis (father), and length of planned surgery. Recommendation for mechanical and pharmacological prophylaxis.  Will discuss with Dr. Carolynne Citron and prescribe Lovenox if indicated.  Otherwise, we will encourage early ambulation and compression stockings.   Pictures obtained: 05/16/2023  Post-op Rx sent to pharmacy: Oxycodone  and Zofran .  Patient was provided with the General Surgical Risk consent document and Pain Medication Agreement prior to their appointment.  They had adequate time to read through the risk consent documents and Pain Medication Agreement. We also discussed them in person together during this preop appointment. All of their questions were answered to their satisfaction.  Recommended calling if they have any further questions.  Risk consent form and Pain Medication Agreement to be scanned into patient's chart.  The risk that can be encountered for this procedure were discussed and include the following but not limited to these: asymmetry, fluid accumulation, firmness of the tissue, skin loss, decrease or no sensation, fat necrosis, bleeding, infection, healing delay.  Deep vein thrombosis, cardiac and pulmonary complications are risks to any procedure.  There are risks of anesthesia, changes to skin sensation and injury to nerves or blood vessels.  The muscle can be temporarily or permanently injured.   You may have an allergic reaction to tape, suture, glue, blood products which can result in skin discoloration, swelling, pain, skin lesions, poor healing.  Any of these can lead to the need for revisonal surgery or stage procedures.  Weight gain  and weigh loss can also effect the long term appearance. The results are not guaranteed to last a lifetime.  Future surgery may be required.      Electronically signed by: Mariel Shope, PA-C 06/20/2023 9:49 AM

## 2023-06-20 NOTE — Progress Notes (Signed)
 Patient ID: Mary Whitehead, female    DOB: 11-14-1990, 33 y.o.   MRN: [id number]  Chief Complaint  Patient presents with   Pre-op Exam      ICD-10-CM   1. Panniculitis  M79.3        History of Present Illness: Mary Whitehead is a 33 y.o.  female  with a history of panniculitis.  She presents for preoperative evaluation for upcoming procedure, panniculectomy, scheduled for 07/13/2023 with Dr.  Carolynne Citron .  The patient has not had problems with anesthesia.  Previous surgeries without complication.  She states that her father has had multiple DVTs in his legs, but attributes it to overall poor health.  She reports that she personally saw hematology and had workup done which did not reveal any personal increased risks of bleeding or clotting.  She denies any personal or other family history of clotting.  She denies any significant history of cardiac or pulmonary disease, nicotine use, varicosities, or inflammatory bowel disease.  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  She uses transdermal birth control patch.  She has 2 sons at home, aged 90 and 54.  Her mother will be assisting with her postoperative recovery.  She will hold her multivitamin 1 week prior to surgery.  Her weight is stable since last encounter and BMI still does not exceed 40 kg/m.  She understands that if she continues with her weight loss journey after surgery it could result in additional excess skin.  She also understands the increased risks associated with obesity at time of surgery.  She would like to proceed.  Summary of Previous Visit: Patient was seen for initial consult 05/16/2023.  At that time, reported 63 pound weight loss after bariatric surgery 2022.  Complained of associated infra pannus rashes refractory to medical management.  On exam, there was excess skin and fat on the anterior abdominal wall extending down to the level of the symphysis pubis.  At time of evaluation, her BMI was 38.7 kg/m.   It was discussed with the patient that she would have a better cosmetic result if she loses more weight prior to surgery and that surgery will be delayed if her BMI exceeds 40 kg/m.  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  Discussed 10 days time off for postoperative recovery, returning Monday 07/23/2023.  PMH Significant for: History of C-section, history of gastric sleeve bariatric surgery, recurrent panniculitis, migraine disorder, anxiety and depression.   Past Medical History: Allergies: No Known Allergies  Current Medications:  Current Outpatient Medications:    Acetaminophen  (TYLENOL  PO), Take by mouth as needed., Disp: , Rfl:    ibuprofen  (ADVIL ) 800 MG tablet, Take 1 tablet (800 mg total) by mouth every 8 (eight) hours as needed., Disp: 30 tablet, Rfl: 0   Multiple Vitamin (MULTIVITAMIN) capsule, Take 1 capsule by mouth daily., Disp: , Rfl:    norelgestromin -ethinyl estradiol  (XULANE) 150-35 MCG/24HR transdermal patch, APPLY 1 PATCH ONCE A WEEK, Disp: 9 patch, Rfl: 0   ondansetron  (ZOFRAN -ODT) 4 MG disintegrating tablet, Take 1 tablet (4 mg total) by mouth every 8 (eight) hours as needed for nausea or vomiting., Disp: 20 tablet, Rfl: 0   oxyCODONE  (ROXICODONE ) 5 MG immediate release tablet, Take 1 tablet (5 mg total) by mouth every 8 (eight) hours as needed for up to 5 days for severe pain (pain score 7-10)., Disp: 15 tablet, Rfl: 0   SUMAtriptan (IMITREX) 50 MG tablet, Take 50 mg  by mouth every 2 (two) hours as needed for migraine. May repeat in 2 hours if headache persists or recurs., Disp: , Rfl:    topiramate (TOPAMAX) 50 MG tablet, Take 50 mg by mouth 2 (two) times daily., Disp: , Rfl:   Past Medical Problems: Past Medical History:  Diagnosis Date   Adenomyosis    Anxiety    Depression    Headache(784.0) 08/02/2012   Migraines   Obesity    STD (sexually transmitted disease)    trichomonas 03-13-22 treated    Past Surgical History: Past Surgical  History:  Procedure Laterality Date   CESAREAN SECTION     CESAREAN SECTION N/A 03/04/2014   Procedure: CESAREAN SECTION;  Surgeon: Lizette Righter. Wynona Hedger, MD;  Location: [facility];  Service: Obstetrics;  Laterality: N/A;   FOREIGN BODY REMOVAL Left 03/01/2023   Procedure: REMOVAL FOREIGN BODY LEFT ARM;  Surgeon: Kinsinger, Alphonso Aschoff, MD;  Location: [facility] [place];  Service: General;  Laterality: Left;   SLEEVE GASTROPLASTY     WISDOM TOOTH EXTRACTION      Social History: Social History   Socioeconomic History   Marital status: Single    Spouse name: Not on file   Number of children: Not on file   Years of education: Not on file   Highest education level: Not on file  Occupational History   Not on file  Tobacco Use   Smoking status: Never   Smokeless tobacco: Never  Vaping Use   Vaping status: Never Used  Substance and Sexual Activity   Alcohol use: Yes    Comment: occ   Drug use: No   Sexual activity: Not Currently    Partners: Male    Birth control/protection: Implant, Abstinence  Other Topics Concern   Not on file  Social History Narrative   Not on file   Social Drivers of Health   Financial Resource Strain: Low Risk  (07/18/2022)   Received from [facility]   Overall Financial Resource Strain (CARDIA)    Difficulty of Paying Living Expenses: Not hard at all  Food Insecurity: No Food Insecurity (07/18/2022)   Received from [facility]   Hunger Vital Sign    Worried About Running Out of Food in the Last Year: Never true    Ran Out of Food in the Last Year: Never true  Transportation Needs: No Transportation Needs (07/18/2022)   Received from [facility] - Transportation    Lack of Transportation (Medical): No    Lack of Transportation (Non-Medical): No  Physical Activity: Insufficiently Active (07/18/2022)   Received from [facility]   Exercise Vital Sign    Days of Exercise per Week: 4 days    Minutes of Exercise per Session: 30 min  Stress:  Stress Concern Present (07/18/2022)   Received from [facility] - Occupational Stress Questionnaire    Feeling of Stress : Rather much  Social Connections: Socially Isolated (07/18/2022)   Received from [facility]   Social Connection and Isolation Panel [NHANES]    Frequency of Communication with Friends and Family: More than three times a week    Frequency of Social Gatherings with Friends and Family: More than three times a week    Attends Religious Services: Never    Database administrator or Organizations: No    Attends Banker Meetings: Never    Marital Status: Never married  Intimate Partner Violence: Not At Risk (07/18/2022)  Received from [facility]   Humiliation, Afraid, Rape, and Kick questionnaire    Fear of Current or Ex-Partner: No    Emotionally Abused: No    Physically Abused: No    Sexually Abused: No    Family History: Family History  Problem Relation Age of Onset   Diabetes Father    Hypertension Father    Cancer Father    Cancer Maternal Grandmother    Diabetes Paternal Grandmother    Hypertension Paternal Grandmother     Review of Systems: ROS Denies any recent chest pain, difficulty breathing, leg swelling, fevers.  Physical Exam: Vital Signs Pulse 72   Ht 5\' 2"  (1.575 m)   Wt 213 lb 9.6 oz (96.9 kg)   SpO2 100%   BMI 39.07 kg/m   Physical Exam Constitutional:      General: Not in acute distress.    Appearance: Normal appearance. Not ill-appearing.  HENT:     Head: Normocephalic and atraumatic.  Eyes:     Pupils: Pupils are equal, round. Cardiovascular:     Rate and Rhythm: Normal rate.    Pulses: Normal pulses.  Pulmonary:     Effort: No respiratory distress or increased work of breathing.  Speaks in full sentences. Abdominal:     General: Abdomen is flat. No distension.   Musculoskeletal: Normal range of motion. No lower extremity swelling or edema. No  varicosities. Skin:    General: Skin is warm and dry.     Findings: No erythema or rash.  Neurological:     Mental Status: Alert and oriented to person, place, and time.  Psychiatric:        Mood and Affect: Mood normal.        Behavior: Behavior normal.    Assessment/Plan: The patient is scheduled for panniculectomy with Dr.  Carolynne Citron .  Risks, benefits, and alternatives of procedure discussed, questions answered and consent obtained.    Smoking Status: Non-smoker.  Caprini Score: 7; Risk Factors include: BMI greater than 25, multiple spontaneous miscarriages, family history of thrombosis (father), and length of planned surgery. Recommendation for mechanical and pharmacological prophylaxis.  Will discuss with Dr. Carolynne Citron and prescribe Lovenox if indicated.  Otherwise, we will encourage early ambulation and compression stockings.   Pictures obtained: 05/16/2023  Post-op Rx sent to pharmacy: Oxycodone  and Zofran .  Patient was provided with the General Surgical Risk consent document and Pain Medication Agreement prior to their appointment.  They had adequate time to read through the risk consent documents and Pain Medication Agreement. We also discussed them in person together during this preop appointment. All of their questions were answered to their satisfaction.  Recommended calling if they have any further questions.  Risk consent form and Pain Medication Agreement to be scanned into patient's chart.  The risk that can be encountered for this procedure were discussed and include the following but not limited to these: asymmetry, fluid accumulation, firmness of the tissue, skin loss, decrease or no sensation, fat necrosis, bleeding, infection, healing delay.  Deep vein thrombosis, cardiac and pulmonary complications are risks to any procedure.  There are risks of anesthesia, changes to skin sensation and injury to nerves or blood vessels.  The muscle can be temporarily or permanently injured.   You may have an allergic reaction to tape, suture, glue, blood products which can result in skin discoloration, swelling, pain, skin lesions, poor healing.  Any of these can lead to the need for revisonal surgery or stage procedures.  Weight gain  and weigh loss can also effect the long term appearance. The results are not guaranteed to last a lifetime.  Future surgery may be required.      Electronically signed by: Mariel Shope, PA-C 06/20/2023 9:49 AM

## 2023-06-26 DIAGNOSIS — Z719 Counseling, unspecified: Secondary | ICD-10-CM

## 2023-07-09 ENCOUNTER — Encounter (HOSPITAL_BASED_OUTPATIENT_CLINIC_OR_DEPARTMENT_OTHER): Payer: Self-pay | Admitting: Plastic Surgery

## 2023-07-09 ENCOUNTER — Other Ambulatory Visit: Payer: Self-pay

## 2023-07-13 ENCOUNTER — Ambulatory Visit (HOSPITAL_BASED_OUTPATIENT_CLINIC_OR_DEPARTMENT_OTHER)
Admission: RE | Admit: 2023-07-13 | Discharge: 2023-07-13 | Disposition: A | Discharge status: Home / Self Care | Attending: Plastic Surgery | Admitting: Plastic Surgery

## 2023-07-13 ENCOUNTER — Other Ambulatory Visit: Payer: Self-pay

## 2023-07-13 ENCOUNTER — Encounter (HOSPITAL_BASED_OUTPATIENT_CLINIC_OR_DEPARTMENT_OTHER): Payer: Self-pay | Admitting: Plastic Surgery

## 2023-07-13 ENCOUNTER — Encounter (HOSPITAL_BASED_OUTPATIENT_CLINIC_OR_DEPARTMENT_OTHER)
Admission: RE | Disposition: A | Discharge status: Home / Self Care | Payer: Self-pay | Source: Home / Self Care | Attending: Plastic Surgery

## 2023-07-13 ENCOUNTER — Ambulatory Visit (HOSPITAL_BASED_OUTPATIENT_CLINIC_OR_DEPARTMENT_OTHER): Payer: Self-pay | Admitting: Anesthesiology

## 2023-07-13 DIAGNOSIS — M793 Panniculitis, unspecified: Secondary | ICD-10-CM | POA: Insufficient documentation

## 2023-07-13 DIAGNOSIS — Z8249 Family history of ischemic heart disease and other diseases of the circulatory system: Secondary | ICD-10-CM | POA: Insufficient documentation

## 2023-07-13 DIAGNOSIS — Z01818 Encounter for other preprocedural examination: Secondary | ICD-10-CM

## 2023-07-13 DIAGNOSIS — E669 Obesity, unspecified: Secondary | ICD-10-CM | POA: Insufficient documentation

## 2023-07-13 DIAGNOSIS — Z6838 Body mass index (BMI) 38.0-38.9, adult: Secondary | ICD-10-CM | POA: Insufficient documentation

## 2023-07-13 DIAGNOSIS — Z79899 Other long term (current) drug therapy: Secondary | ICD-10-CM | POA: Insufficient documentation

## 2023-07-13 HISTORY — PX: PANNICULECTOMY: SHX5360

## 2023-07-13 LAB — POCT PREGNANCY, URINE: Preg Test, Ur: NEGATIVE

## 2023-07-13 SURGERY — PANNICULECTOMY
Anesthesia: General | Site: Abdomen

## 2023-07-13 MED ORDER — 0.9 % SODIUM CHLORIDE (POUR BTL) OPTIME
TOPICAL | Status: DC | PRN
  Administered 2023-07-13 (×3): 1000 mL

## 2023-07-13 MED ORDER — FENTANYL CITRATE (PF) 100 MCG/2ML IJ SOLN
INTRAMUSCULAR | Status: DC | PRN
  Administered 2023-07-13: 50 ug via INTRAVENOUS
  Administered 2023-07-13: 100 ug via INTRAVENOUS
  Administered 2023-07-13: 50 ug via INTRAVENOUS

## 2023-07-13 MED ORDER — OXYCODONE HCL 5 MG/5ML PO SOLN: 5.0000 mg | Freq: Once | ORAL | Status: AC | PRN

## 2023-07-13 MED ORDER — FENTANYL CITRATE (PF) 100 MCG/2ML IJ SOLN
INTRAMUSCULAR | Status: AC
  Filled 2023-07-13: qty 2

## 2023-07-13 MED ORDER — ONDANSETRON HCL 4 MG/2ML IJ SOLN
INTRAMUSCULAR | Status: DC | PRN
  Administered 2023-07-13: 4 mg via INTRAVENOUS

## 2023-07-13 MED ORDER — MEPERIDINE HCL 25 MG/ML IJ SOLN: 6.2500 mg | INTRAMUSCULAR | Status: DC | PRN

## 2023-07-13 MED ORDER — PROPOFOL 10 MG/ML IV BOLUS
INTRAVENOUS | Status: AC
  Filled 2023-07-13: qty 20

## 2023-07-13 MED ORDER — ACETAMINOPHEN 500 MG PO TABS
1000.0000 mg | ORAL_TABLET | Freq: Once | ORAL | Status: AC
  Administered 2023-07-13: 1000 mg via ORAL

## 2023-07-13 MED ORDER — PHENYLEPHRINE HCL (PRESSORS) 10 MG/ML IV SOLN
INTRAVENOUS | Status: DC | PRN
  Administered 2023-07-13 (×5): 160 ug via INTRAVENOUS

## 2023-07-13 MED ORDER — FENTANYL CITRATE (PF) 100 MCG/2ML IJ SOLN
INTRAMUSCULAR | Status: AC
Start: 2023-07-13 — End: ?
  Filled 2023-07-13: qty 2

## 2023-07-13 MED ORDER — CHLORHEXIDINE GLUCONATE CLOTH 2 % EX PADS: 6.0000 | MEDICATED_PAD | Freq: Once | CUTANEOUS | Status: DC

## 2023-07-13 MED ORDER — OXYCODONE HCL 5 MG PO TABS
ORAL_TABLET | ORAL | Status: AC
  Filled 2023-07-13: qty 1

## 2023-07-13 MED ORDER — MIDAZOLAM HCL 5 MG/5ML IJ SOLN
INTRAMUSCULAR | Status: DC | PRN
  Administered 2023-07-13: 2 mg via INTRAVENOUS

## 2023-07-13 MED ORDER — CELECOXIB 200 MG PO CAPS: 200.0000 mg | ORAL_CAPSULE | Freq: Once | ORAL | Status: AC

## 2023-07-13 MED ORDER — SODIUM CHLORIDE (PF) 0.9 % IJ SOLN
INTRAMUSCULAR | Status: DC | PRN
  Administered 2023-07-13: 100 mL

## 2023-07-13 MED ORDER — HYDROMORPHONE HCL 1 MG/ML IJ SOLN
INTRAMUSCULAR | Status: AC
  Filled 2023-07-13: qty 0.5

## 2023-07-13 MED ORDER — CELECOXIB 200 MG PO CAPS
ORAL_CAPSULE | ORAL | Status: AC
  Filled 2023-07-13: qty 1

## 2023-07-13 MED ORDER — BUPIVACAINE LIPOSOME 1.3 % IJ SUSP
INTRAMUSCULAR | Status: AC
  Filled 2023-07-13: qty 20

## 2023-07-13 MED ORDER — DEXAMETHASONE SODIUM PHOSPHATE 4 MG/ML IJ SOLN
INTRAMUSCULAR | Status: DC | PRN
  Administered 2023-07-13: 5 mg via INTRAVENOUS

## 2023-07-13 MED ORDER — ROCURONIUM BROMIDE 100 MG/10ML IV SOLN
INTRAVENOUS | Status: DC | PRN
  Administered 2023-07-13: 70 mg via INTRAVENOUS

## 2023-07-13 MED ORDER — ACETAMINOPHEN 500 MG PO TABS
ORAL_TABLET | ORAL | Status: AC
  Filled 2023-07-13: qty 2

## 2023-07-13 MED ORDER — HYDROMORPHONE HCL 1 MG/ML IJ SOLN
INTRAMUSCULAR | Status: DC | PRN
  Administered 2023-07-13: .5 mg via INTRAVENOUS

## 2023-07-13 MED ORDER — ROCURONIUM BROMIDE 10 MG/ML (PF) SYRINGE
PREFILLED_SYRINGE | INTRAVENOUS | Status: AC
  Filled 2023-07-13: qty 10

## 2023-07-13 MED ORDER — ACETAMINOPHEN 500 MG PO TABS: 1000.0000 mg | ORAL_TABLET | Freq: Once | ORAL | Status: AC

## 2023-07-13 MED ORDER — ONDANSETRON HCL 4 MG/2ML IJ SOLN
INTRAMUSCULAR | Status: AC
  Filled 2023-07-13: qty 2

## 2023-07-13 MED ORDER — LACTATED RINGERS IV SOLN: INTRAVENOUS | Status: DC

## 2023-07-13 MED ORDER — DEXAMETHASONE SODIUM PHOSPHATE 10 MG/ML IJ SOLN
INTRAMUSCULAR | Status: AC
  Filled 2023-07-13: qty 1

## 2023-07-13 MED ORDER — FENTANYL CITRATE (PF) 100 MCG/2ML IJ SOLN
25.0000 ug | INTRAMUSCULAR | Status: DC | PRN
  Administered 2023-07-13 (×2): 25 ug via INTRAVENOUS

## 2023-07-13 MED ORDER — MIDAZOLAM HCL 2 MG/2ML IJ SOLN
INTRAMUSCULAR | Status: AC
  Filled 2023-07-13: qty 2

## 2023-07-13 MED ORDER — SUGAMMADEX SODIUM 200 MG/2ML IV SOLN
INTRAVENOUS | Status: DC | PRN
  Administered 2023-07-13: 100 mg via INTRAVENOUS
  Administered 2023-07-13: 200 mg via INTRAVENOUS

## 2023-07-13 MED ORDER — ONDANSETRON HCL 4 MG/2ML IJ SOLN: 4.0000 mg | Freq: Once | INTRAMUSCULAR | Status: DC | PRN

## 2023-07-13 MED ORDER — LIDOCAINE 2% (20 MG/ML) 5 ML SYRINGE
INTRAMUSCULAR | Status: AC
  Filled 2023-07-13: qty 5

## 2023-07-13 MED ORDER — LIDOCAINE HCL (CARDIAC) PF 100 MG/5ML IV SOSY
PREFILLED_SYRINGE | INTRAVENOUS | Status: DC | PRN
  Administered 2023-07-13: 100 mg via INTRAVENOUS

## 2023-07-13 MED ORDER — CEFAZOLIN SODIUM-DEXTROSE 2-4 GM/100ML-% IV SOLN
INTRAVENOUS | Status: AC
  Filled 2023-07-13: qty 100

## 2023-07-13 MED ORDER — CHLORHEXIDINE GLUCONATE CLOTH 2 % EX PADS
6.0000 | MEDICATED_PAD | Freq: Once | CUTANEOUS | Status: AC
  Administered 2023-07-13: 6 via TOPICAL

## 2023-07-13 MED ORDER — CEFAZOLIN SODIUM-DEXTROSE 2-4 GM/100ML-% IV SOLN
2.0000 g | INTRAVENOUS | Status: AC
  Administered 2023-07-13: 2 g via INTRAVENOUS

## 2023-07-13 MED ORDER — OXYCODONE HCL 5 MG PO TABS
5.0000 mg | ORAL_TABLET | Freq: Once | ORAL | Status: AC | PRN
  Administered 2023-07-13: 5 mg via ORAL

## 2023-07-13 MED ORDER — CELECOXIB 200 MG PO CAPS
200.0000 mg | ORAL_CAPSULE | Freq: Once | ORAL | Status: AC
  Administered 2023-07-13: 200 mg via ORAL

## 2023-07-13 MED ORDER — PROPOFOL 10 MG/ML IV BOLUS
INTRAVENOUS | Status: DC | PRN
  Administered 2023-07-13: 120 mg via INTRAVENOUS

## 2023-07-13 SURGICAL SUPPLY — 56 items
BINDER ABDOMINAL 10 UNV 27-48 (MISCELLANEOUS) IMPLANT
BINDER ABDOMINAL 12 ML 46-62 (SOFTGOODS) IMPLANT
BINDER ABDOMINAL 12 SM 30-45 (SOFTGOODS) IMPLANT
BIOPATCH RED 1 DISK 7.0 (GAUZE/BANDAGES/DRESSINGS) ×2 IMPLANT
BLADE CLIPPER SURG (BLADE) IMPLANT
BLADE SURG 10 STRL SS (BLADE) ×4 IMPLANT
BLADE SURG 11 STRL SS (BLADE) IMPLANT
BLADE SURG 15 STRL LF DISP TIS (BLADE) ×1 IMPLANT
CANISTER SUCT 1200ML W/VALVE (MISCELLANEOUS) ×1 IMPLANT
CLIP APPLIE 9.375 MED OPEN (MISCELLANEOUS) IMPLANT
DERMABOND ADVANCED .7 DNX12 (GAUZE/BANDAGES/DRESSINGS) ×2 IMPLANT
DRAIN CHANNEL 19F RND (DRAIN) ×2 IMPLANT
DRAPE UTILITY XL STRL (DRAPES) ×1 IMPLANT
DRSG TEGADERM 4X4.75 (GAUZE/BANDAGES/DRESSINGS) ×2 IMPLANT
ELECT COATED BLADE 2.86 ST (ELECTRODE) IMPLANT
ELECTRODE BLDE 4.0 EZ CLN MEGD (MISCELLANEOUS) ×1 IMPLANT
ELECTRODE REM PT RTRN 9FT ADLT (ELECTROSURGICAL) ×2 IMPLANT
EVACUATOR SILICONE 100CC (DRAIN) ×2 IMPLANT
GAUZE PAD ABD 8X10 STRL (GAUZE/BANDAGES/DRESSINGS) ×2 IMPLANT
GAUZE SPONGE 2X2 STRL 8-PLY (GAUZE/BANDAGES/DRESSINGS) ×2 IMPLANT
GLOVE BIO SURGEON STRL SZ 6.5 (GLOVE) IMPLANT
GLOVE BIO SURGEON STRL SZ7.5 (GLOVE) ×1 IMPLANT
GLOVE BIO SURGEON STRL SZ8 (GLOVE) ×2 IMPLANT
GLOVE BIOGEL PI IND STRL 7.0 (GLOVE) IMPLANT
GLOVE BIOGEL PI IND STRL 8 (GLOVE) ×2 IMPLANT
GOWN STRL REUS W/ TWL LRG LVL3 (GOWN DISPOSABLE) ×1 IMPLANT
GOWN STRL REUS W/TWL XL LVL3 (GOWN DISPOSABLE) ×2 IMPLANT
HEMOSTAT ARISTA ABSORB 3G PWDR (HEMOSTASIS) IMPLANT
HIBICLENS CHG 4% 4OZ BTL (MISCELLANEOUS) ×1 IMPLANT
MANIFOLD NEPTUNE II (INSTRUMENTS) IMPLANT
NDL HYPO 22X1.5 SAFETY MO (MISCELLANEOUS) ×2 IMPLANT
NEEDLE HYPO 22X1.5 SAFETY MO (MISCELLANEOUS) ×2 IMPLANT
NS IRRIG 1000ML POUR BTL (IV SOLUTION) ×1 IMPLANT
PACK BASIN DAY SURGERY FS (CUSTOM PROCEDURE TRAY) ×1 IMPLANT
PACK UNIVERSAL I (CUSTOM PROCEDURE TRAY) ×1 IMPLANT
PENCIL SMOKE EVACUATOR (MISCELLANEOUS) ×2 IMPLANT
PIN SAFETY STERILE (MISCELLANEOUS) ×1 IMPLANT
SLEEVE SCD COMPRESS KNEE MED (STOCKING) ×1 IMPLANT
SPONGE T-LAP 18X18 ~~LOC~~+RFID (SPONGE) ×2 IMPLANT
STAPLER INSORB 30 2030 C-SECTI (MISCELLANEOUS) IMPLANT
STAPLER SKIN PROX WIDE 3.9 (STAPLE) ×1 IMPLANT
SUT MNCRL AB 3-0 PS2 27 (SUTURE) ×4 IMPLANT
SUT MNCRL AB 4-0 PS2 18 (SUTURE) ×4 IMPLANT
SUT SILK 2 0 PERMA HAND 18 BK (SUTURE) ×2 IMPLANT
SUT VIC AB 2-0 CT1 TAPERPNT 27 (SUTURE) ×2 IMPLANT
SUT VIC AB 3-0 PS1 18XBRD (SUTURE) IMPLANT
SUT VIC AB 3-0 SH 27X BRD (SUTURE) ×1 IMPLANT
SUT VICRYL AB 3 0 TIES (SUTURE) IMPLANT
SYR 20ML LL LF (SYRINGE) ×2 IMPLANT
SYR BULB IRRIG 60ML STRL (SYRINGE) ×1 IMPLANT
SYR CONTROL 10ML LL (SYRINGE) ×1 IMPLANT
TOWEL GREEN STERILE FF (TOWEL DISPOSABLE) ×2 IMPLANT
TRAY DSU PREP LF (CUSTOM PROCEDURE TRAY) ×1 IMPLANT
TUBE CONNECTING 20X1/4 (TUBING) ×1 IMPLANT
UNDERPAD 30X36 HEAVY ABSORB (UNDERPADS AND DIAPERS) ×2 IMPLANT
YANKAUER SUCT BULB TIP NO VENT (SUCTIONS) ×1 IMPLANT

## 2023-07-13 NOTE — Interval H&P Note (Signed)
 History and Physical Interval Note: No change in exam or indication for surgery Marked for a panniculectomy with her concurrence All questions answered. Will proceed at her request  07/13/2023 11:21 AM  Mary Whitehead  has presented today for surgery, with the diagnosis of Panniculitis.  The various methods of treatment have been discussed with the patient and family. After consideration of risks, benefits and other options for treatment, the patient has consented to  Procedure(s): PANNICULECTOMY (N/A) as a surgical intervention.  The patient's history has been reviewed, patient examined, no change in status, stable for surgery.  I have reviewed the patient's chart and labs.  Questions were answered to the patient's satisfaction.     Teretha Ferguson

## 2023-07-13 NOTE — Discharge Instructions (Addendum)
 No Tylenol  before 4pm today. No Ibuprofen /NSAIDS before 6pm today.   Post Anesthesia Home Care Instructions  Activity: Get plenty of rest for the remainder of the day. A responsible individual must stay with you for 24 hours following the procedure.  For the next 24 hours, DO NOT: -Drive a car -Advertising copywriter -Drink alcoholic beverages -Take any medication unless instructed by your physician -Make any legal decisions or sign important papers.  Meals: Start with liquid foods such as gelatin or soup. Progress to regular foods as tolerated. Avoid greasy, spicy, heavy foods. If nausea and/or vomiting occur, drink only clear liquids until the nausea and/or vomiting subsides. Call your physician if vomiting continues.  Special Instructions/Symptoms: Your throat may feel dry or sore from the anesthesia or the breathing tube placed in your throat during surgery. If this causes discomfort, gargle with warm salt water . The discomfort should disappear within 24 hours.  If you had a scopolamine  patch placed behind your ear for the management of post- operative nausea and/or vomiting:  1. The medication in the patch is effective for 72 hours, after which it should be removed.  Wrap patch in a tissue and discard in the trash. Wash hands thoroughly with soap and water . 2. You may remove the patch earlier than 72 hours if you experience unpleasant side effects which may include dry mouth, dizziness or visual disturbances. 3. Avoid touching the patch. Wash your hands with soap and water  after contact with the patch.   Information for Discharge Teaching: EXPAREL  (bupivacaine  liposome injectable suspension)   Pain relief is important to your recovery. The goal is to control your pain so you can move easier and return to your normal activities as soon as possible after your procedure. Your physician may use several types of medicines to manage pain, swelling, and more.  Your surgeon or  anesthesiologist gave you EXPAREL (bupivacaine ) to help control your pain after surgery.  EXPAREL  is a local anesthetic designed to release slowly over an extended period of time to provide pain relief by numbing the tissue around the surgical site. EXPAREL  is designed to release pain medication over time and can control pain for up to 72 hours. Depending on how you respond to EXPAREL , you may require less pain medication during your recovery. EXPAREL  can help reduce or eliminate the need for opioids during the first few days after surgery when pain relief is needed the most. EXPAREL  is not an opioid and is not addictive. It does not cause sleepiness or sedation.   Important! A teal colored band has been placed on your arm with the date, time and amount of EXPAREL  you have received. Please leave this armband in place for the full 96 hours following administration, and then you may remove the band. If you return to the hospital for any reason within 96 hours following the administration of EXPAREL , the armband provides important information that your health care providers to know, and alerts them that you have received this anesthetic.    Possible side effects of EXPAREL : Temporary loss of sensation or ability to move in the area where medication was injected. Nausea, vomiting, constipation Rarely, numbness and tingling in your mouth or lips, lightheadedness, or anxiety may occur. Call your doctor right away if you think you may be experiencing any of these sensations, or if you have other questions regarding possible side effects.  Follow all other discharge instructions given to you by your surgeon or nurse. Eat a healthy diet and drink plenty  of water  or other fluids.  INSTRUCTIONS FOR AFTER ABDOMINAL SURGERY   You will likely have some questions about what to expect following your operation.  The following information will help you and your family understand what to expect when you are  discharged from the hospital.  It is important to follow these guidelines to help ensure a smooth recovery and reduce complication.  Postoperative instructions include information on: diet, wound care, medications and physical activity.  AFTER SURGERY Expect to go home after the procedure.  In some cases, you may need to spend one night in the hospital for observation.  DIET Abdominal surgery does not require a specific diet.  However, the healthier you eat the better your body will heal. It is important to increasing your protein intake.  This means limiting the foods with sugar and carbohydrates.  Focus on vegetables and some meat.  If you have liposuction during your procedure be sure to drink water .  If your urine is bright yellow, then it is concentrated, and you need to drink more water .  As a general rule after surgery, you should have 8 ounces of water  every hour while awake.  If you find you are persistently nauseated or unable to take in liquids let us  know.  NO TOBACCO USE or EXPOSURE.  This will slow your healing process and lead to a wound.  WOUND CARE Leave the binder on at all times except when showering . Use fragrance free soap like Dial, Dove or [place].   After 24 hours you can remove the binder to shower. Once dry apply binder or compressive spanx. No baths, pools or hot tubs for four weeks. We close your incision to leave the smallest and best-looking scar. No ointment or creams on your incisions for four weeks.  No Neosporin (Too many skin reactions).  A few weeks after surgery you can use Mederma and start massaging the scar. We ask you to wear your binder or compressive spanx for the first 6 weeks around the clock, including while sleeping. This provides added comfort and helps reduce the fluid accumulation at the surgery site. NO Ice or heating pads to the operative site.  You have a very high risk of a BURN before you feel the temperature change. Continue to empty, recharge, &  record drainage from drains 2-3 times a day, as needed.  ACTIVITY No heavy lifting until cleared by the doctor.  This usually means no more than a half-gallon of milk.  It is OK to walk and climb stairs. Moving your legs is very important to decrease your risk of a blood clot.  It will also help keep you from getting deconditioned.  Every 1 to 2 hours get up and walk for 5 minutes. This will help with a quicker recovery back to normal.  Let pain be your guide so you don't do too much.  This time is for you to recover.  You will be more comfortable if you sleep and rest with your head elevated either with a few pillows under you or in a recliner.  No stomach sleeping for a three months.  WORK Everyone returns to work at different times. As a rough guide, most people take at least 1 - 2 weeks off prior to returning to work. If you need documentation for your job, give the forms to the front staff at the clinic.  DRIVING Arrange for someone to bring you home from the hospital after your surgery.  You may be  able to drive a few days after surgery but not while taking any narcotics or valium.  BOWEL MOVEMENTS Constipation can occur after anesthesia and while taking pain medication.  It is important to stay ahead for your comfort.  We recommend taking Milk of Magnesia (2 tablespoons; twice a day) while taking the pain pills.  MEDICATIONS You may be prescribed should start after surgery At your preoperative visit for you history and physical you may have been given the following medications: Zofran  4 mg:  This is to treat nausea and vomiting.  You can take this every 6 hours as needed and only if needed. 2.   Oxycodone  5 mg:  This is only to be used after you have taken the Motrin  or the Tylenol . Every 8 hours as needed.   Over the counter Medication to take: Ibuprofen  (Motrin ) 600 mg:  Take this every 6 hours.  If you have additional pain then take 500 mg of the Tylenol  every 8 hours.  Only take the  Norco after you have tried these two. MiraLAX or Milk of Magnesia: Take this according to the bottle if you take the Norco.  WHEN TO CALL Call your surgeon's office if any of the following occur: Fever 101 degrees F or greater Excessive bleeding or fluid from the incision site. Pain that increases over time without aid from the medications Redness, warmth, or pus draining from incision sites Persistent nausea or inability to take in liquids Severe misshapen area that underwent the operation.

## 2023-07-13 NOTE — Transfer of Care (Signed)
 Immediate Anesthesia Transfer of Care Note  Patient: Mary Whitehead  Procedure(s) Performed: PANNICULECTOMY (Abdomen)  Patient Location: PACU  Anesthesia Type:General  Level of Consciousness: awake, alert , and oriented  Airway & Oxygen Therapy: Patient Spontanous Breathing and Patient connected to face mask oxygen  Post-op Assessment: Report given to RN and Post -op Vital signs reviewed and stable  Post vital signs: Reviewed and stable  Last Vitals:  Vitals Value Taken Time  BP 95/40 07/13/23 1326  Temp    Pulse 81 07/13/23 1329  Resp 13 07/13/23 1329  SpO2 100 % 07/13/23 1329  Vitals shown include unfiled device data.  Last Pain:  Vitals:   07/13/23 0952  TempSrc:   PainSc: 0-No pain      Patients Stated Pain Goal: 3 (07/13/23 0946)  Complications: No notable events documented.

## 2023-07-13 NOTE — Anesthesia Preprocedure Evaluation (Addendum)
 Anesthesia Evaluation  Patient identified by MRN, date of birth, ID band Patient awake    Reviewed: Allergy & Precautions, NPO status , Patient's Chart, lab work & pertinent test results  History of Anesthesia Complications Negative for: history of anesthetic complications  Airway Mallampati: II  TM Distance: >3 FB Neck ROM: Full    Dental  (+) Dental Advisory Given, Teeth Intact   Pulmonary neg pulmonary ROS   Pulmonary exam normal        Cardiovascular negative cardio ROS Normal cardiovascular exam     Neuro/Psych  Headaches PSYCHIATRIC DISORDERS Anxiety Depression       GI/Hepatic negative GI ROS, Neg liver ROS,,,  Endo/Other   Obesity   Renal/GU negative Renal ROS     Musculoskeletal negative musculoskeletal ROS (+)    Abdominal   Peds  Hematology negative hematology ROS (+)   Anesthesia Other Findings   Reproductive/Obstetrics                             Anesthesia Physical Anesthesia Plan  ASA: 2  Anesthesia Plan: General   Post-op Pain Management: Tylenol  PO (pre-op)* and Celebrex PO (pre-op)*   Induction: Intravenous  PONV Risk Score and Plan: 3 and Treatment may vary due to age or medical condition, Ondansetron  and Dexamethasone   Airway Management Planned: Oral ETT and LMA  Additional Equipment: None  Intra-op Plan:   Post-operative Plan: Extubation in OR  Informed Consent: I have reviewed the patients History and Physical, chart, labs and discussed the procedure including the risks, benefits and alternatives for the proposed anesthesia with the patient or authorized representative who has indicated his/her understanding and acceptance.     Dental advisory given  Plan Discussed with: CRNA and Anesthesiologist  Anesthesia Plan Comments:        Anesthesia Quick Evaluation

## 2023-07-13 NOTE — Op Note (Signed)
 DATE OF OPERATION: 07/13/2023  LOCATION: [facility] operating Room  PREOPERATIVE DIAGNOSIS: Panniculitis  POSTOPERATIVE DIAGNOSIS: Same  PROCEDURE: Panniculectomy  SURGEON: Kurtis Philips, MD  ASSISTANT: Mariel Shope  EBL: 50 cc  CONDITION: Stable  COMPLICATIONS: None  INDICATION: The patient, Mary Whitehead, is a 33 y.o. female born on 02/27/1991, is here for treatment of ongoing rashes on the posterior aspect of the pannus.   PROCEDURE DETAILS:  The patient was seen prior to surgery and marked.  The IV antibiotics were given. The patient was taken to the operating room and given a general anesthetic. A standard time out was performed and all information was confirmed by those in the room. SCDs were placed.   The abdomen prepped and draped in usual sterile manner.  A low transverse incision was made across the abdomen and the electrocautery was used to dissect the soft tissues to the anterior abdominal wall.  The pannus was dissected away from the anterior abdominal wall in a caudad to cranial manner to the level of the umbilicus.  A counterincision was made at the upper portion of the pannus and the pannus removed.  Hemostasis was achieved with electrocautery.  The subfascial space over the rectus muscle and the external obliques and the subcutaneous tissues without infiltrated with 100 mL of a mixture of Exparel , quarter percent plain Marcaine  and saline.  219 French round drains were placed in the surgical bed and brought out through separate stab incisions.  Skin edges were tailor tacked in place with skin clips and the Scarpa's fascia reapproximated with interrupted 2-0 Vicryl sutures.  The dermis was closed with interrupted and running 3-0 Monocryl sutures and skin was closed in a running 4-0 Monocryl subcuticular stitch.  Incisions were sealed with Dermabond and the patient was placed in a compressive garment.  She was awakened from anesthesia without incident transferred to the  recovery room in good condition.  All instrument needle and sponge counts reported as correct there were no complications appreciated during the procedure. The patient was allowed to wake up and taken to recovery room in stable condition at the end of the case. The family was notified at the end of the case.   The advanced practice practitioner (APP) assisted throughout the case.  The APP was essential in retraction and counter traction when needed to make the case progress smoothly.  This retraction and assistance made it possible to see the tissue plans for the procedure.  The assistance was needed for blood control, tissue re-approximation and assisted with closure of the incision site.

## 2023-07-13 NOTE — Anesthesia Postprocedure Evaluation (Signed)
 Anesthesia Post Note  Patient: Mary Whitehead  Procedure(s) Performed: PANNICULECTOMY (Abdomen)     Patient location during evaluation: PACU Anesthesia Type: General Level of consciousness: awake and alert Pain management: pain level controlled Vital Signs Assessment: post-procedure vital signs reviewed and stable Respiratory status: spontaneous breathing, nonlabored ventilation, respiratory function stable and patient connected to nasal cannula oxygen Cardiovascular status: blood pressure returned to baseline and stable Postop Assessment: no apparent nausea or vomiting Anesthetic complications: no   No notable events documented.  Last Vitals:  Vitals:   07/13/23 1430 07/13/23 1456  BP: 104/66 122/83  Pulse: 70 72  Resp: 15 16  Temp:  (!) 36.1 C  SpO2: 96% 99%    Last Pain:  Vitals:   07/13/23 1459  TempSrc:   PainSc: 4                  Rebecca Motta

## 2023-07-13 NOTE — Anesthesia Procedure Notes (Signed)
 Procedure Name: Intubation Date/Time: 07/13/2023 11:51 AM  Performed by: Raymona Caldwell, CRNAPre-anesthesia Checklist: Patient identified, Emergency Drugs available, Suction available and Patient being monitored Patient Re-evaluated:Patient Re-evaluated prior to induction Oxygen Delivery Method: Circle system utilized Preoxygenation: Pre-oxygenation with 100% oxygen Induction Type: IV induction Ventilation: Mask ventilation without difficulty Laryngoscope Size: Mac and 4 Grade View: Grade I Tube type: Oral Tube size: 7.0 mm Number of attempts: 1 Airway Equipment and Method: Stylet and Oral airway Placement Confirmation: ETT inserted through vocal cords under direct vision, positive ETCO2, breath sounds checked- equal and bilateral and CO2 detector Secured at: 22 cm Tube secured with: Tape Dental Injury: Teeth and Oropharynx as per pre-operative assessment

## 2023-07-14 ENCOUNTER — Encounter (HOSPITAL_BASED_OUTPATIENT_CLINIC_OR_DEPARTMENT_OTHER): Payer: Self-pay | Admitting: Plastic Surgery

## 2023-07-19 ENCOUNTER — Ambulatory Visit (INDEPENDENT_AMBULATORY_CARE_PROVIDER_SITE_OTHER): Admitting: Plastic Surgery

## 2023-07-19 ENCOUNTER — Encounter: Payer: Self-pay | Admitting: Plastic Surgery

## 2023-07-19 VITALS — BP 134/80 | HR 85 | Ht 62.0 in | Wt 215.2 lb

## 2023-07-19 DIAGNOSIS — Z9889 Other specified postprocedural states: Secondary | ICD-10-CM

## 2023-07-19 NOTE — Progress Notes (Signed)
 Mary Whitehead returns today 6 days postop from a panniculectomy.  She is up and active.  She still notes moderate pain when sitting.  She denies any fevers or chills and has had a bowel movement.  Drain output is less than 30 mL per 24 hours from both drains.  The incision is clean dry and intact without drainage or erythema.  The left drain is removed today without difficulty.  She is encouraged to slowly increase activity.  She feels that she is unable to return to work next week because she sits for her job.  Will extend her out of work.  Through next week.  She may return next Tuesday if her drain output does not increase over 30 mL per 24 hours.  Keep scheduled appointments.

## 2023-07-24 ENCOUNTER — Ambulatory Visit (INDEPENDENT_AMBULATORY_CARE_PROVIDER_SITE_OTHER): Admitting: Student

## 2023-07-24 DIAGNOSIS — Z9889 Other specified postprocedural states: Secondary | ICD-10-CM

## 2023-07-24 NOTE — Progress Notes (Signed)
 Patient is a 33 year old female who underwent panniculectomy with Dr. Carolynne Citron on 07/13/2023.  She is about a week and a half postop.  She presents to the clinic today for possible drain removal.  Patient was most recently seen in the clinic on 07/19/2023.  At this visit, patient reported moderate pain when sitting, but denied any fevers or chills.  Her drain output had been less than 30 cc per 24 hours for both drains.  Incision was clean dry and intact without any drainage or erythema.  The left drain was removed without any difficulty.  Today, patient reports she is doing well.  She states that her right drain has put out less than 30 cc for the past few days.  She does report sometimes she has a little bit of a tugging sensation to her abdomen.  Otherwise she denies any other issues or concerns.  She denies any fevers or chills.  She denies any drainage from her incision.  Chaperone present on exam.  On exam, patient is sitting upright in no acute distress.  Abdomen is soft and nontender.  There is no overlying erythema.  No obvious fluid collections palpated on exam.  Inferior abdominal incision appears to be clean dry and intact.  Left drain site appears to be healing well.  Right drain is in place with minimal serous drainage in the bulb.  There are no signs of infection on exam.  Right drain was removed without any difficulty.  Patient tolerated well.  Discussed with patient that I would like her to apply gauze over her right drain site daily for the next few days.  I instructed her to change it if it is saturated.  I discussed with her that in a few days, she may start applying Vaseline over the drain site.  Patient expressed understanding.  Discussed with the patient that starting later this week, Friday or Saturday, she may apply Vaseline to her incisions.  Discussed with patient the importance of wearing compression at all times.  She expressed understanding.  Patient to follow-up at her next  scheduled appointment.  I instructed her to call in the meantime if she has any questions or concerns about anything.

## 2023-08-03 NOTE — Progress Notes (Signed)
 Patient is a pleasant 33 year old female s/p panniculectomy performed 07/13/2023 by Dr. Carolynne Citron who presents to clinic for postoperative follow-up.  She was last seen in clinic on 07/24/2023 at which time her right drain was removed without complication.  Today, patient is doing well.  She is pleased.  She denies any fevers, chest pain, difficulty breathing, leg swelling, or abdominal pain.  She does have some residual skin glue, particularly laterally on each side.  She is tolerating p.o. intake without difficulty, voiding, ambulatory, and overall doing excellent from a postoperative standpoint.  On exam, incision CDI throughout.  Mild amount of residual Dermabond at lateral aspects bilaterally.  A few scattered sutures are removed without complication or difficulty.  Recommending Vaseline to the entire length of the incision daily and follow-up as scheduled.  At that time, can likely begin to lift activity restrictions and transition to silicone scar gel.  Picture(s) obtained of the patient and placed in the chart were with the patient's or guardian's permission.

## 2023-08-06 ENCOUNTER — Ambulatory Visit: Admitting: Physician Assistant

## 2023-08-06 VITALS — BP 111/79 | HR 81

## 2023-08-06 DIAGNOSIS — Z9889 Other specified postprocedural states: Secondary | ICD-10-CM

## 2023-08-17 NOTE — Progress Notes (Signed)
 Patient is a pleasant 33 year old female s/p panniculectomy performed 07/13/2023 by Dr. Waddell who presents to clinic for postoperative follow-up.   She was last seen here in clinic on 08/06/2023.  At that time, her exam was entirely reassuring and she was doing well.  Her residual drain was removed.  Recommended Vaseline along the entire length of the incision daily and follow-up as scheduled.  Today, patient is doing well.  She is quite pleased with her outcome.  She has been applying Vaseline to her incision, as discussed.  She continues to deny any leg swelling, chest pain, difficulty breathing, fevers, or other concerns.  She is eager to resume normal activity.  She continues to wear her compressive binder.  On exam, abdomen is soft and nondistended.  Nontender to palpation.  Small, superficial irritation less than 1 cm centrally as well as right lateral aspect.  Suspect that these small superficial wounds will likely heal without complication in the next week.  Remainder of incision CDI throughout.  No surrounding erythema or other overlying skin changes.  Continue with Vaseline application to her small, less than 1 cm superficial incisional wounds.  Elsewhere she can begin silicone scar treatments.  Recommend twice daily x 3 months.  Continue with mechanical massage of the incision throughout.  She may increase activity as tolerated.  No ongoing restrictions given her entirely reassuring exam.  Patient is pleased.  Photos were obtained at last visit.  Follow-up only as needed

## 2023-08-20 ENCOUNTER — Ambulatory Visit (INDEPENDENT_AMBULATORY_CARE_PROVIDER_SITE_OTHER): Admitting: Physician Assistant

## 2023-08-20 ENCOUNTER — Encounter: Payer: Self-pay | Admitting: Physician Assistant

## 2023-08-20 VITALS — BP 109/76 | HR 76

## 2023-08-20 DIAGNOSIS — Z9889 Other specified postprocedural states: Secondary | ICD-10-CM

## 2023-09-18 ENCOUNTER — Encounter: Payer: Self-pay | Admitting: Radiology

## 2023-09-18 ENCOUNTER — Ambulatory Visit (INDEPENDENT_AMBULATORY_CARE_PROVIDER_SITE_OTHER): Admitting: Radiology

## 2023-09-18 VITALS — BP 104/68 | Ht 62.0 in | Wt 215.6 lb

## 2023-09-18 DIAGNOSIS — Z01419 Encounter for gynecological examination (general) (routine) without abnormal findings: Secondary | ICD-10-CM | POA: Diagnosis not present

## 2023-09-18 DIAGNOSIS — N760 Acute vaginitis: Secondary | ICD-10-CM

## 2023-09-18 DIAGNOSIS — Z3045 Encounter for surveillance of transdermal patch hormonal contraceptive device: Secondary | ICD-10-CM

## 2023-09-18 DIAGNOSIS — Z113 Encounter for screening for infections with a predominantly sexual mode of transmission: Secondary | ICD-10-CM

## 2023-09-18 DIAGNOSIS — Z1331 Encounter for screening for depression: Secondary | ICD-10-CM | POA: Diagnosis not present

## 2023-09-18 MED ORDER — NORELGESTROMIN-ETH ESTRADIOL 150-35 MCG/24HR TD PTWK: 1.0000 | MEDICATED_PATCH | TRANSDERMAL | 4 refills | Status: AC

## 2023-09-18 NOTE — Patient Instructions (Signed)

## 2023-09-18 NOTE — Progress Notes (Signed)
 Mary Whitehead 11/19/1990 [id number]   History:  33 y.o. G5P2 presents for annual exam. Recent panniculectomy in May, healing well. Needs refills on xulane patch. Lost her father recently. C/o vaginitis, would like STI screen. Used monistat 1 last week, caused burning.  Gynecologic History Patient's last menstrual period was 09/14/2023. Period Cycle (Days): 28 Period Duration (Days): 4-5 Period Pattern: Regular Menstrual Flow: Heavy Menstrual Control: Thin pad, Maxi pad, Tampon Dysmenorrhea: (!) Severe Dysmenorrhea Symptoms: Cramping Contraception/Family planning: xulane patch Sexually active: yes Last Pap: 2023. Results were: normal   Obstetric History OB History  Gravida Para Term Preterm AB Living  5 2 2  3 2   SAB IAB Ectopic Multiple Live Births  3   0 2    # Outcome Date GA Lbr Len/2nd Weight Sex Type Anes PTL Lv  5 Term 03/04/14 100w3d  7 lb 11.6 oz (3.504 kg) M CS-LTranv EPI  LIV     Birth Comments: Hgb, Normal, FA Newborn Screen Barcode: [id number] Date Collected: 03/05/2014 Name on Newborn screen is spelled Jase Lochlear.   4 SAB           3 SAB           2 SAB           1 Term                09/18/2023    3:15 PM  Depression screen PHQ 2/9  Decreased Interest 1  Down, Depressed, Hopeless 1  PHQ - 2 Score 2  Altered sleeping 1  Tired, decreased energy 1  Change in appetite 0  Feeling bad or failure about yourself  0  Trouble concentrating 0  Moving slowly or fidgety/restless 0  Suicidal thoughts 0  PHQ-9 Score 4  Difficult doing work/chores Not difficult at all     The following portions of the patient's history were reviewed and updated as appropriate: allergies, current medications, past family history, past medical history, past social history, past surgical history, and problem list.  Review of Systems  All other systems reviewed and are negative.   Past medical history, past surgical history, family history and social history were all  reviewed and documented in the EPIC chart.  Exam:  Vitals:   09/18/23 1513  BP: 104/68  Weight: 215 lb 9.6 oz (97.8 kg)  Height: 5' 2 (1.575 m)   Body mass index is 39.43 kg/m.  Physical Exam Vitals and nursing note reviewed. Exam conducted with a chaperone present.  Constitutional:      Appearance: Normal appearance. She is normal weight.  HENT:     Head: Normocephalic and atraumatic.  Neck:     Thyroid: No thyroid mass, thyromegaly or thyroid tenderness.  Cardiovascular:     Rate and Rhythm: Regular rhythm.     Heart sounds: Normal heart sounds.  Pulmonary:     Effort: Pulmonary effort is normal.     Breath sounds: Normal breath sounds.  Chest:  Breasts:    Breasts are symmetrical.     Right: Normal. No inverted nipple, mass, nipple discharge, skin change or tenderness.     Left: Normal. No inverted nipple, mass, nipple discharge, skin change or tenderness.  Abdominal:     General: Abdomen is flat. Bowel sounds are normal.     Palpations: Abdomen is soft.  Genitourinary:    General: Normal vulva.     Vagina: Normal. No vaginal discharge, bleeding or lesions.     Cervix: Normal. No discharge  or lesion.     Uterus: Normal. Not enlarged and not tender.      Adnexa: Right adnexa normal and left adnexa normal.       Right: No mass, tenderness or fullness.         Left: No mass, tenderness or fullness.    Lymphadenopathy:     Upper Body:     Right upper body: No axillary adenopathy.     Left upper body: No axillary adenopathy.  Skin:    General: Skin is warm and dry.  Neurological:     Mental Status: She is alert and oriented to person, place, and time.  Psychiatric:        Mood and Affect: Mood normal.        Thought Content: Thought content normal.        Judgment: Judgment normal.      Darice Hoit, CMA present for exam  Assessment/Plan:   1. Well woman exam with routine gynecological exam (Primary) Pap 2026  2. Encounter for surveillance of transdermal  patch hormonal contraceptive device - norelgestromin -ethinyl estradiol  (XULANE) 150-35 MCG/24HR transdermal patch; Place 1 patch onto the skin once a week.  Dispense: 9 patch; Refill: 4  3. Acute vaginitis - SureSwab Advanced Vaginitis Plus,TMA  4. Screening for STDs (sexually transmitted diseases) - SureSwab Advanced Vaginitis Plus,TMA  5. Depression screen Some sadness related to grief    Return in about 1 year (around 09/17/2024) for Annual.  GINETTE COZIER B WHNP-BC 3:31 PM 09/18/2023

## 2023-09-19 LAB — SURESWAB® ADVANCED VAGINITIS PLUS,TMA
C. trachomatis RNA, TMA: NOT DETECTED
CANDIDA SPECIES: NOT DETECTED
Candida glabrata: NOT DETECTED
N. gonorrhoeae RNA, TMA: NOT DETECTED
SURESWAB(R) ADV BACTERIAL VAGINOSIS(BV),TMA: NEGATIVE
TRICHOMONAS VAGINALIS (TV),TMA: NOT DETECTED

## 2023-09-20 ENCOUNTER — Ambulatory Visit: Payer: Self-pay | Admitting: Radiology
